# Patient Record
Sex: Female | Born: 2000 | Race: Black or African American | Hispanic: No | Marital: Single | Smoking: Never smoker
Health system: Southern US, Community
[De-identification: ages and names within clinical notes are randomized; demographics above are authoritative.]

## PROBLEM LIST (undated history)

## (undated) DIAGNOSIS — T4145XA Adverse effect of unspecified anesthetic, initial encounter: Secondary | ICD-10-CM

## (undated) DIAGNOSIS — T8859XA Other complications of anesthesia, initial encounter: Secondary | ICD-10-CM

## (undated) DIAGNOSIS — Z8249 Family history of ischemic heart disease and other diseases of the circulatory system: Secondary | ICD-10-CM

## (undated) DIAGNOSIS — M24411 Recurrent dislocation, right shoulder: Secondary | ICD-10-CM

## (undated) DIAGNOSIS — M419 Scoliosis, unspecified: Secondary | ICD-10-CM

## (undated) HISTORY — PX: WISDOM TOOTH EXTRACTION: SHX21

---
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
## 2018-03-22 ENCOUNTER — Emergency Department (HOSPITAL_COMMUNITY)
Admission: EM | Admit: 2018-03-22 | Discharge: 2018-03-22 | Disposition: A | Discharge status: Home / Self Care | Payer: Medicaid Other | Attending: Emergency Medicine | Admitting: Emergency Medicine

## 2018-03-22 ENCOUNTER — Encounter (HOSPITAL_COMMUNITY): Payer: Self-pay | Admitting: *Deleted

## 2018-03-22 ENCOUNTER — Ambulatory Visit (HOSPITAL_COMMUNITY)
Admission: EM | Admit: 2018-03-22 | Discharge: 2018-03-22 | Discharge status: Absent without leave | Payer: No Typology Code available for payment source

## 2018-03-22 ENCOUNTER — Other Ambulatory Visit: Payer: Self-pay

## 2018-03-22 DIAGNOSIS — F17290 Nicotine dependence, other tobacco product, uncomplicated: Secondary | ICD-10-CM | POA: Insufficient documentation

## 2018-03-22 DIAGNOSIS — I951 Orthostatic hypotension: Secondary | ICD-10-CM | POA: Insufficient documentation

## 2018-03-22 DIAGNOSIS — R55 Syncope and collapse: Secondary | ICD-10-CM | POA: Diagnosis present

## 2018-03-22 LAB — BASIC METABOLIC PANEL
Anion gap: 7 (ref 5–15)
BUN: 7 mg/dL (ref 6–20)
CALCIUM: 9.3 mg/dL (ref 8.9–10.3)
CO2: 22 mmol/L (ref 22–32)
CREATININE: 0.89 mg/dL (ref 0.44–1.00)
Chloride: 106 mmol/L (ref 98–111)
GFR calc Af Amer: >60 mL/min (ref >60)
GLUCOSE: 107 mg/dL — AB (ref 70–99)
Potassium: 3.6 mmol/L (ref 3.5–5.1)
Sodium: 135 mmol/L (ref 135–145)

## 2018-03-22 LAB — CBC
HCT: 36 % (ref 36.0–46.0)
Hemoglobin: 11.8 g/dL — ABNORMAL LOW (ref 12.0–15.0)
MCH: 31.1 pg (ref 26.0–34.0)
MCHC: 32.8 g/dL (ref 30.0–36.0)
MCV: 95 fL (ref 78.0–100.0)
Platelets: 204 10*3/uL (ref 150–400)
RBC: 3.79 MIL/uL — ABNORMAL LOW (ref 3.87–5.11)
RDW: 12.5 % (ref 11.5–15.5)
WBC: 6.1 10*3/uL (ref 4.0–10.5)

## 2018-03-22 LAB — I-STAT BETA HCG BLOOD, ED (MC, WL, AP ONLY): I-stat hCG, quantitative: <5 m[IU]/mL (ref <5)

## 2018-03-22 MED ORDER — SODIUM CHLORIDE 0.9 % IV BOLUS
1000.0000 mL | Freq: Once | INTRAVENOUS | Status: AC
Start: 2018-03-22 — End: 2018-03-22
  Administered 2018-03-22: 1000 mL via INTRAVENOUS

## 2018-03-22 NOTE — ED Triage Notes (Signed)
Pt states she keeps having fainting spells and has had 3 episodes over the last 1-2 months.  Pt gets really hot and dizzy and then everything goes black.  [place] and felt her face hit [place].  Pt has pain to left face where she fell.

## 2018-03-22 NOTE — ED Notes (Signed)
Pt stable, ambulatory, states understanding of discharge instructions 

## 2018-03-22 NOTE — Discharge Instructions (Signed)
Contact a health care provider if: °You vomit. °You have diarrhea. °You have a fever for more than 2-3 days. °You feel more thirsty than usual. °You feel weak and tired. °Get help right away if: °You have chest pain. °You have a fast or irregular heartbeat. °You develop numbness in any part of your body. °You cannot move your arms or your legs. °You have trouble speaking. °You become sweaty or feel light-headed. °You faint. °You feel short of breath. °You have trouble staying awake. °You feel confused. °

## 2018-03-22 NOTE — ED Provider Notes (Signed)
[facility] EMERGENCY DEPARTMENT Provider Note   CSN: [id number] Arrival date & time: 03/22/18  1334     History   Chief Complaint Chief Complaint  Patient presents with  . Loss of Consciousness    HPI Theresa Edwards is a 17 y.o. female who presents the emergency department chief complaint of syncope.  Patient has had 2 previous episodes of presyncope however had her first episode of true syncope today.  About a month ago the patient was ordering food when she had onset of symptoms of presyncope including tunnel vision, wearing in her ears, lightheadedness feeling like she is going to pass out.  She did not fully lose consciousness and walked out the front door and sat down on the curb.  She was able to sit and then regain stability.  She denies full loss of consciousness, racing or skipping in her heart, chest pain or shortness of breath.  Patient had a second similar episode after walking her dog but was able to get to her bed.  Today the patient was ordering lunch at [employer].  She started to feel very lightheaded she got hot she went to the bathroom and sat on the toilet states that she was feeling better but she still heard the whirring sound in her ears .  She went back to the counter to pay for her food then suddenly she remembers hitting the ground feeling the cold floor on her face.  She is not fully lost consciousness before today.  Patient has no unilateral leg swelling, chest pain, hemoptysis.  She does not smoke cigarettes or use nicotine.  She does not take exogenous estrogens.  Patient has no family history of sudden cardiac death.  She is taking no medications.  She denies volume loss or dehydration.  HPI  Past Medical History:  Diagnosis Date  . Complication of anesthesia    has hx. of shaking after anesthesia  . Family history of blood clots   . 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 Scoliosis     Patient Active Problem List   Diagnosis  Date Noted  . Anxiety 08/09/2017  . Unintentional weight loss 08/09/2017  . 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 Recurrent dislocation of right shoulder   . Family history of blood clots     Past Surgical History:  Procedure Laterality Date  . KNEE ARTHROSCOPY WITH ANTERIOR CRUCIATE LIGAMENT (ACL) REPAIR WITH HAMSTRING GRAFT Right 11/30/2014   Procedure: RIGHT KNEE ARTHROSCOPY WITH ANTERIOR CRUCIATE LIGAMENT (ACL)  AUTOGRAFT  HAMSTRING, GRAFT LINK TECHNIQUE;  Surgeon: Salvatore Marvel, MD;  Location: [facility];  Service: Orthopedics;  Laterality: Right;  . MENISCUS REPAIR Right 11/30/2014   Procedure: LATERAL MENISCUS REPAIR;  Surgeon: Salvatore Marvel, MD;  Location: [facility];  Service: Orthopedics;  Laterality: Right;  . SHOULDER ARTHROSCOPY WITH BANKART REPAIR Right 12/10/2016   Procedure: SHOULDER ARTHROSCOPY WITH CAPSULORRHAPHY  REPAIR WITH SCALENE BLOCK;  Surgeon: Salvatore Marvel, MD;  Location: [facility];  Service: Orthopedics;  Laterality: Right;  . SHOULDER ARTHROSCOPY WITH CAPSULORRHAPHY Right 12/09/2017   Procedure: RIGHT SHOULDER REVISION ANTERIOR AND Rudene Re;  Surgeon: Salvatore Marvel, MD;  Location:  SURGERY CENTER;  Service: Orthopedics;  Laterality: Right;  GENERAL,PRE/POST OP SCALENE  . WISDOM TOOTH EXTRACTION       OB History   None      Home Medications    Prior to Admission medications   Medication Sig Start Date End  Date Taking? Authorizing Provider  ibuprofen (ADVIL,MOTRIN) 100 MG tablet Take 100 mg by mouth every 6 (six) hours as needed for pain or fever.   Yes [provider]  Multiple Vitamins-Minerals (MULTIVITAMIN WITH MINERALS) tablet Take 1 tablet by mouth daily.   Yes [provider]  cyclobenzaprine (FLEXERIL) 5 MG tablet Take 1 tablet (5 mg total) by mouth 3 (three) times daily as needed for muscle spasms. Patient not taking: Reported on 03/22/2018 12/09/17    Shepperson, Kirstin, PA-C  oxyCODONE (OXY IR/ROXICODONE) 5 MG immediate release tablet Take 1 tablet (5 mg total) by mouth every 4 (four) hours as needed for severe pain. Patient not taking: Reported on 03/22/2018 12/09/17   Julien Girt, PA-C    Family History Family History  Problem Relation Age of Onset  . Heart disease Maternal Aunt        MI  . Heart disease Paternal Grandfather        MI  . Hypertension Mother   . Hypertension Maternal Uncle   . Kidney disease Maternal Uncle     Social History Social History   Tobacco Use  . Smoking status: Current Every Day Smoker    Types: E-cigarettes  . Smokeless tobacco: Never Used  Substance Use Topics  . Alcohol use: No  . Drug use: Yes    Types: Marijuana     Allergies   Patient has no known allergies.   Review of Systems Review of Systems  Ten systems reviewed and are negative for acute change, except as noted in the HPI.   Physical Exam Updated Vital Signs BP 105/61   Pulse (!) 52   Temp 98.6 F (37 C) (Oral)   Resp 17   Ht 5\' 5"  (1.651 m)   Wt 51.3 kg   LMP 03/02/2018 (Approximate)   SpO2 100%   BMI 18.80 kg/m   Physical Exam  Constitutional: She is oriented to person, place, and time. She appears well-developed and well-nourished. No distress.  HENT:  Head: Normocephalic and atraumatic.  Eyes: Conjunctivae are normal. No scleral icterus.  Neck: Normal range of motion.  Cardiovascular: Normal rate, regular rhythm and normal heart sounds. Exam reveals no gallop and no friction rub.  No murmur heard. Pulmonary/Chest: Effort normal and breath sounds normal. No respiratory distress.  Abdominal: Soft. Bowel sounds are normal. She exhibits no distension and no mass. There is no tenderness. There is no guarding.  Neurological: She is alert and oriented to person, place, and time.  Speech is clear and goal oriented, follows commands Major Cranial nerves without deficit, no facial droop Normal strength  in upper and lower extremities bilaterally including dorsiflexion and plantar flexion, strong and equal grip strength Sensation normal to light and sharp touch Moves extremities without ataxia, coordination intact Normal finger to nose and rapid alternating movements Neg romberg, no pronator drift Normal gait Normal heel-shin and balance   Skin: Skin is warm and dry. Capillary refill takes less than 2 seconds. She is not diaphoretic.  Psychiatric: Her behavior is normal.  Nursing note and vitals reviewed.    ED Treatments / Results  Labs (all labs ordered are listed, but only abnormal results are displayed) Labs Reviewed  BASIC METABOLIC PANEL - Abnormal; Notable for the following components:      Result Value   Glucose, Bld 107 (*)    All other components within normal limits  CBC - Abnormal; Notable for the following components:   RBC 3.79 (*)    Hemoglobin  11.8 (*)    All other components within normal limits  URINALYSIS, ROUTINE W REFLEX MICROSCOPIC  CBG MONITORING, ED  I-STAT BETA HCG BLOOD, ED ([facility], [facility], [facility] ONLY)    EKG None  Radiology No results found.  Procedures Procedures (including critical care time)  Medications Ordered in ED Medications  sodium chloride 0.9 % bolus 1,000 mL (has no administration in time range)     Initial Impression / Assessment and Plan / ED Course  I have reviewed the triage vital signs and the nursing notes.  Pertinent labs & imaging results that were available during my care of the patient were reviewed by me and considered in my medical decision making (see chart for details).  Clinical Course as of Mar 22 1600  Sat Mar 22, 2018  1557 Patient with positive orthostatic VS.   Her EKG is shows sinus arrhythmia.  She meets PERC.  Her chemistry is negative and her hemoglobin is just below normal.  Patient will get a bolus of fluid and we will discharge her to follow-up with her primary care physician in the outpatient setting.   [AH]     Clinical Course User Index [AH] Arthor Captain, PA-C   Patient here for syncope.  History gathered by the patient and her mother who is at bedside the differential for syncope is extensive and includes, but is not limited to: arrythmia (Vtach, SVT, SSS, sinus arrest, AV block, bradycardia) aortic stenosis, AMI, HOCM, PE, atrial myxoma, pulmonary hypertension, orthostatic hypotension, (hypovolemia, drug effect, GB syndrome, micturition, cough, swall) carotid sinus sensitivity, Seizure, TIA/CVA, hypoglycemia,  Vertigo. EKG is unremarkable.  Lab work shows no evidence of anemia, severe dehydration.  She does have positive orthostatic.  Given fluid bolus here.  Patient advised to follow closely with her primary care physician in the next 7 to 10 days for further work-up including potential cardiac event monitoring.  She is PERC negative.  Appears appropriate for discharge at this time  Final Clinical Impressions(s) / ED Diagnoses   Final diagnoses:  Orthostatic syncope    ED Discharge Orders    None       Arthor Captain, PA-C 03/22/18 1653    Little, Ambrose Finland, MD 03/23/18 [phone]

## 2018-04-11 ENCOUNTER — Ambulatory Visit (INDEPENDENT_AMBULATORY_CARE_PROVIDER_SITE_OTHER): Payer: Medicaid Other | Admitting: Family Medicine

## 2018-04-11 VITALS — BP 109/80 | HR 79 | Temp 98.4°F | Wt 113.8 lb

## 2018-04-11 DIAGNOSIS — R6889 Other general symptoms and signs: Secondary | ICD-10-CM

## 2018-04-11 NOTE — Progress Notes (Signed)
  Subjective:    Patient ID: Theresa Edwards, female    DOB: 05-29-2001, 17 y.o.   MRN: [id number]   CC: cold all the time (Patient 20 min late to appointment but requested to be seen with this 1 complaint)  HPI:  Cold intolerance: Patient reports that she is cold all the time. She says she has felt this way her entire life, but she is coming in today because she is worried it may have been related to a recent episode where she passed out. She was see in the ED, given fluids and evaluated with  BMP, CBC and urine pregnancy which revealed normal labs except that patient is anemic with Hgb of 11.8. Patient started multivitamin with iron since then. She is concerned because she is also not able to gain weight. She currently has a job and is going to school for business. She is not concerned with school, states that it is hard, but she is doing fine.   Smoking status reviewed  ROS: 10 point ROS is otherwise negative, except as mentioned in HPI  Patient Active Problem List   Diagnosis Date Noted  . 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 Anxiety 08/09/2017  . Unintentional weight loss 08/09/2017  . 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 Recurrent dislocation of right shoulder   . Family history of blood clots      Objective:  BP 109/80   Pulse 79   Temp 98.4 F (36.9 C)   Wt 113 lb 12.8 oz (51.6 kg)   SpO2 100%   BMI 18.94 kg/m  Vitals and nursing note reviewed  General: NAD, pleasant Cardiac: RRR, normal heart sounds, no murmurs Respiratory: CTAB, normal effort Extremities: no edema or cyanosis. WWP. Skin: warm and dry, no rashes noted Neuro: alert and oriented, no focal deficits Psych: flat affect, patient appears withdrawn  Assessment & Plan:   Cold intolerance Likely that this is simply patient's baseline. Doubt related to anemia with hgb only slightly low at 11.8. Patient now taking supplements. Patient does have some sx c/w hyperthyroidism such as inability to gain  weight and some weight loss, some tachycardia, although cold intolerance would fit more with hypothyroidism. Patient states that she eats regular meals as she is trying to gain weight, so doubt anorexia. Patient declined any blood work today, but will return in 6 weeks to discuss this issue more and will get blood work at that time.  - Will check TSH, and will recheck hgb in 6 weeks to determine if iron supplements helping - Will obtain PHQ9 and GAD 7 at follow up appt   [place] Imogene Gravelle, DO Family Medicine Resident PGY-2

## 2018-04-11 NOTE — Patient Instructions (Signed)
Thank you for coming to see me today. It was a pleasure! Today we talked about:   For feeling cold all the time. Please continue taking your iron supplementation. You could just often be colder than other people around since this is a chronic finding.   Please follow-up with me in 6 weeks or sooner as needed. Please call and come in for lab work to make sure that your thyroid is not causing these symptoms, or you may wait until your next appointment to have labs drawn.   If you have any questions or concerns, please do not hesitate to call the office at [phone].  Take Care,   [place] Demitrus Francisco, DO

## 2018-04-15 ENCOUNTER — Encounter: Payer: Self-pay | Admitting: Family Medicine

## 2018-04-15 DIAGNOSIS — R6889 Other general symptoms and signs: Secondary | ICD-10-CM | POA: Insufficient documentation

## 2018-04-15 NOTE — Assessment & Plan Note (Addendum)
Likely that this is simply patient's baseline. Doubt related to anemia with hgb only slightly low at 11.8. Patient now taking supplements. Patient does have some sx c/w hyperthyroidism such as inability to gain weight and some weight loss, some tachycardia, although cold intolerance would fit more with hypothyroidism. Patient states that she eats regular meals as she is trying to gain weight, so doubt anorexia. Patient declined any blood work today, but will return in 6 weeks to discuss this issue more and will get blood work at that time.  - Will check TSH, and will recheck hgb in 6 weeks to determine if iron supplements helping - Will obtain PHQ9 and GAD 7 at follow up appt

## 2018-05-13 ENCOUNTER — Encounter

## 2019-06-09 ENCOUNTER — Ambulatory Visit: Payer: HRSA Program | Attending: Internal Medicine

## 2019-06-09 DIAGNOSIS — U071 COVID-19: Secondary | ICD-10-CM

## 2019-06-09 DIAGNOSIS — R238 Other skin changes: Secondary | ICD-10-CM | POA: Diagnosis present

## 2019-06-09 DIAGNOSIS — Z20828 Contact with and (suspected) exposure to other viral communicable diseases: Secondary | ICD-10-CM | POA: Diagnosis not present

## 2019-06-10 LAB — NOVEL CORONAVIRUS, NAA: SARS-CoV-2, NAA: NOT DETECTED

## 2019-07-01 ENCOUNTER — Other Ambulatory Visit: Payer: Self-pay

## 2020-02-08 ENCOUNTER — Other Ambulatory Visit: Payer: Self-pay

## 2020-02-08 ENCOUNTER — Encounter (HOSPITAL_BASED_OUTPATIENT_CLINIC_OR_DEPARTMENT_OTHER): Payer: Self-pay | Admitting: *Deleted

## 2020-02-08 ENCOUNTER — Emergency Department (HOSPITAL_BASED_OUTPATIENT_CLINIC_OR_DEPARTMENT_OTHER)
Admission: EM | Admit: 2020-02-08 | Discharge: 2020-02-08 | Disposition: A | Discharge status: Home / Self Care | Payer: Self-pay | Attending: Emergency Medicine | Admitting: Emergency Medicine

## 2020-02-08 DIAGNOSIS — R6889 Other general symptoms and signs: Secondary | ICD-10-CM | POA: Insufficient documentation

## 2020-02-08 DIAGNOSIS — Z5321 Procedure and treatment not carried out due to patient leaving prior to being seen by health care provider: Secondary | ICD-10-CM | POA: Insufficient documentation

## 2020-02-08 NOTE — ED Triage Notes (Signed)
States she wants her VS checked. Her veins are popping out and she wants to make sure she is not dehydrated or her iron may be low.

## 2020-06-25 ENCOUNTER — Encounter (HOSPITAL_BASED_OUTPATIENT_CLINIC_OR_DEPARTMENT_OTHER): Payer: Self-pay | Admitting: Emergency Medicine

## 2020-06-25 ENCOUNTER — Emergency Department (HOSPITAL_BASED_OUTPATIENT_CLINIC_OR_DEPARTMENT_OTHER)
Admission: EM | Admit: 2020-06-25 | Discharge: 2020-06-25 | Disposition: A | Discharge status: Home / Self Care | Payer: Self-pay | Attending: Emergency Medicine | Admitting: Emergency Medicine

## 2020-06-25 ENCOUNTER — Other Ambulatory Visit: Payer: Self-pay

## 2020-06-25 DIAGNOSIS — R1110 Vomiting, unspecified: Secondary | ICD-10-CM | POA: Insufficient documentation

## 2020-06-25 DIAGNOSIS — Z5321 Procedure and treatment not carried out due to patient leaving prior to being seen by health care provider: Secondary | ICD-10-CM | POA: Insufficient documentation

## 2020-06-25 NOTE — ED Notes (Signed)
No response when called to place in a room. Pt also did not answer earlier when called

## 2020-06-25 NOTE — ED Triage Notes (Signed)
Reports she took a xanax about two hours ago.  Now doesn't feel right.  States I did feel confused but I feel better now.  Denies taking medication trying to hurt herself.  States "I was just being stupid".  Unsure of the dose or who the medication belongs to.  Also reports vomiting X 1 pta.  Currently only complaints is being drowsy.

## 2020-06-25 NOTE — ED Notes (Signed)
Pt called to provide pt with urine specimen collection device, no answer in WR

## 2020-08-25 ENCOUNTER — Other Ambulatory Visit: Payer: Self-pay

## 2020-08-25 ENCOUNTER — Emergency Department (HOSPITAL_BASED_OUTPATIENT_CLINIC_OR_DEPARTMENT_OTHER)
Admission: EM | Admit: 2020-08-25 | Discharge: 2020-08-25 | Disposition: A | Discharge status: Home / Self Care | Payer: Self-pay | Attending: Emergency Medicine | Admitting: Emergency Medicine

## 2020-08-25 ENCOUNTER — Encounter (HOSPITAL_BASED_OUTPATIENT_CLINIC_OR_DEPARTMENT_OTHER): Payer: Self-pay | Admitting: Emergency Medicine

## 2020-08-25 DIAGNOSIS — R112 Nausea with vomiting, unspecified: Secondary | ICD-10-CM | POA: Insufficient documentation

## 2020-08-25 DIAGNOSIS — R197 Diarrhea, unspecified: Secondary | ICD-10-CM | POA: Insufficient documentation

## 2020-08-25 DIAGNOSIS — Z5321 Procedure and treatment not carried out due to patient leaving prior to being seen by health care provider: Secondary | ICD-10-CM | POA: Insufficient documentation

## 2020-08-25 NOTE — ED Triage Notes (Signed)
N/v/d since yesterday  States may be food poisoning

## 2020-09-27 ENCOUNTER — Encounter (HOSPITAL_BASED_OUTPATIENT_CLINIC_OR_DEPARTMENT_OTHER): Payer: Self-pay

## 2020-09-27 ENCOUNTER — Other Ambulatory Visit: Payer: Self-pay

## 2020-09-27 ENCOUNTER — Emergency Department (HOSPITAL_BASED_OUTPATIENT_CLINIC_OR_DEPARTMENT_OTHER)
Admission: EM | Admit: 2020-09-27 | Discharge: 2020-09-28 | Disposition: A | Discharge status: Home / Self Care | Payer: Self-pay | Attending: Emergency Medicine | Admitting: Emergency Medicine

## 2020-09-27 DIAGNOSIS — H579 Unspecified disorder of eye and adnexa: Secondary | ICD-10-CM | POA: Insufficient documentation

## 2020-09-27 DIAGNOSIS — R0981 Nasal congestion: Secondary | ICD-10-CM | POA: Insufficient documentation

## 2020-09-27 DIAGNOSIS — J3489 Other specified disorders of nose and nasal sinuses: Secondary | ICD-10-CM | POA: Insufficient documentation

## 2020-09-27 DIAGNOSIS — R059 Cough, unspecified: Secondary | ICD-10-CM | POA: Insufficient documentation

## 2020-09-27 NOTE — ED Triage Notes (Signed)
Pt states last week she was dx with bronchitis and ear infection, still taking antibiotics and inhaler. Reports she is still having cough and woke up today and her right eye is red and burning and itching.

## 2020-09-28 MED ORDER — ERYTHROMYCIN 5 MG/GM OP OINT
TOPICAL_OINTMENT | Freq: Once | OPHTHALMIC | Status: AC
  Administered 2020-09-28: 1 via OPHTHALMIC
  Filled 2020-09-28: qty 3.5

## 2020-09-28 MED ORDER — AMOXICILLIN-POT CLAVULANATE 875-125 MG PO TABS: 1.0000 | ORAL_TABLET | Freq: Two times a day (BID) | ORAL | 0 refills | Status: AC

## 2020-09-28 NOTE — ED Provider Notes (Signed)
[facility] EMERGENCY DEPARTMENT Provider Note  CSN: [id number] Arrival date & time: 09/27/20 2306  Chief Complaint(s) Cough  HPI Theresa Edwards is a 20 y.o. female   The history is provided by the patient.  Cough Cough characteristics:  Non-productive and hacking Severity:  Moderate Onset quality:  Gradual Duration:  2 weeks Timing:  Constant Progression:  Waxing and waning Chronicity:  New Relieved by:  Nothing Worsened by:  Nothing Ineffective treatments:  Beta-agonist inhaler Associated symptoms: eye discharge and rhinorrhea    Seen by UC and Rx'd Zpak last Thursday. Also given albuterol. No improvement.  Past Medical History Past Medical History:  Diagnosis Date  . Complication of anesthesia    has hx. of shaking after anesthesia  . Family history of blood clots   . 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 Scoliosis    Patient Active Problem List   Diagnosis Date Noted  . 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 Anxiety 08/09/2017  . Unintentional weight loss 08/09/2017  . 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 Recurrent dislocation of right shoulder   . Family history of blood clots    Home Medication(s) Prior to Admission medications   Medication Sig Start Date End Date Taking? Authorizing Provider  amoxicillin-clavulanate (AUGMENTIN) 875-125 MG tablet Take 1 tablet by mouth every 12 (twelve) hours for 10 days. 10/02/20 10/12/20 Yes Leshay Desaulniers, Amadeo GarnetPedro Eduardo, MD  cyclobenzaprine (FLEXERIL) 5 MG tablet Take 1 tablet (5 mg total) by mouth 3 (three) times daily as needed for muscle spasms. Patient not taking: Reported on 03/22/2018 12/09/17   Shepperson, Kirstin, PA-C  ibuprofen (ADVIL,MOTRIN) 100 MG tablet Take 100 mg by mouth every 6 (six) hours as needed for pain or fever.    [provider]  Multiple Vitamins-Minerals (MULTIVITAMIN WITH MINERALS) tablet Take 1 tablet by mouth daily.    [provider]                                                                                                                                     Past Surgical History Past Surgical History:  Procedure Laterality Date  . KNEE ARTHROSCOPY WITH ANTERIOR CRUCIATE LIGAMENT (ACL) REPAIR WITH HAMSTRING GRAFT Right 11/30/2014   Procedure: RIGHT KNEE ARTHROSCOPY WITH ANTERIOR CRUCIATE LIGAMENT (ACL)  AUTOGRAFT  HAMSTRING, GRAFT LINK TECHNIQUE;  Surgeon: Salvatore Marvelobert Wainer, MD;  Location: [facility];  Service: Orthopedics;  Laterality: Right;  . MENISCUS REPAIR Right 11/30/2014   Procedure: LATERAL MENISCUS REPAIR;  Surgeon: Salvatore Marvelobert Wainer, MD;  Location: [facility];  Service: Orthopedics;  Laterality: Right;  . SHOULDER ARTHROSCOPY WITH BANKART REPAIR Right 12/10/2016   Procedure: SHOULDER ARTHROSCOPY WITH CAPSULORRHAPHY  REPAIR WITH SCALENE BLOCK;  Surgeon: Salvatore MarvelWainer, Robert, MD;  Location: [facility];  Service: Orthopedics;  Laterality: Right;  . SHOULDER ARTHROSCOPY WITH CAPSULORRHAPHY Right 12/09/2017   Procedure: RIGHT SHOULDER REVISION ANTERIOR AND POSTEROR CAPSULLORAPHY;  Surgeon:  Salvatore Marvel, MD;  Location: [facility];  Service: Orthopedics;  Laterality: Right;  GENERAL,PRE/POST OP SCALENE  . WISDOM TOOTH EXTRACTION     Family History Family History  Problem Relation Age of Onset  . Heart disease Maternal Aunt        MI  . Heart disease Paternal Grandfather        MI  . Hypertension Mother   . Hypertension Maternal Uncle   . Kidney disease Maternal Uncle     Social History Social History   Tobacco Use  . Smoking status: Never Smoker  . Smokeless tobacco: Never Used  Vaping Use  . Vaping Use: Every day  Substance Use Topics  . Alcohol use: No  . Drug use: Yes    Types: Marijuana   Allergies Patient has no known allergies.  Review of Systems Review of Systems  HENT: Positive for congestion, rhinorrhea and sinus pressure.   Eyes: Positive for discharge and redness.   Respiratory: Positive for cough.    All other systems are reviewed and are negative for acute change except as noted in the HPI  Physical Exam Vital Signs  I have reviewed the triage vital signs BP 126/73 (BP Location: Right Arm)   Pulse 65   Temp 98.7 F (37.1 C) (Oral)   Resp 18   Ht 5\' 6"  (1.676 m)   Wt 50.3 kg   LMP 09/27/2020 (Exact Date)   SpO2 100%   BMI 17.92 kg/m   Physical Exam Vitals reviewed.  Constitutional:      General: She is not in acute distress.    Appearance: She is well-developed. She is not diaphoretic.  HENT:     Head: Normocephalic and atraumatic.     Nose: Mucosal edema, congestion and rhinorrhea present.     Mouth/Throat:     Mouth: No oral lesions or angioedema.     Tongue: No lesions.     Pharynx: No pharyngeal swelling, oropharyngeal exudate or posterior oropharyngeal erythema.     Tonsils: No tonsillar exudate.  Eyes:     General: No scleral icterus.       Right eye: No discharge.        Left eye: No discharge.     Conjunctiva/sclera:     Right eye: Right conjunctiva is injected. Exudate (mild mucousy) present. No chemosis or hemorrhage.    Left eye: Left conjunctiva is not injected. No chemosis, exudate or hemorrhage.    Pupils: Pupils are equal, round, and reactive to light.  Cardiovascular:     Rate and Rhythm: Normal rate and regular rhythm.     Heart sounds: No murmur heard. No friction rub. No gallop.   Pulmonary:     Effort: Pulmonary effort is normal. No respiratory distress.     Breath sounds: Normal breath sounds. No stridor. No rales.  Abdominal:     General: There is no distension.     Palpations: Abdomen is soft.     Tenderness: There is no abdominal tenderness.  Musculoskeletal:        General: No tenderness.     Cervical back: Normal range of motion and neck supple.  Skin:    General: Skin is warm and dry.     Findings: No erythema or rash.  Neurological:     Mental Status: She is alert and oriented to person,  place, and time.     ED Results and Treatments Labs (all labs ordered are listed, but only abnormal results are  displayed) Labs Reviewed - No data to display                                                                                                                       EKG  EKG Interpretation  Date/Time:    Ventricular Rate:    PR Interval:    QRS Duration:   QT Interval:    QTC Calculation:   R Axis:     Text Interpretation:        Radiology No results found.  Pertinent labs & imaging results that were available during my care of the patient were reviewed by me and considered in my medical decision making (see chart for details).  Medications Ordered in ED Medications  erythromycin ophthalmic ointment (1 application Right Eye Given 09/28/20 0026)                                                                                                                                    Procedures Procedures  (including critical care time)  Medical Decision Making / ED Course I have reviewed the nursing notes for this encounter and the patient's prior records (if available in EHR or on provided paperwork).   Theresa Edwards was evaluated in Emergency Department on 09/28/2020 for the symptoms described in the history of present illness. She was evaluated in the context of the global COVID-19 pandemic, which necessitated consideration that the patient might be at risk for infection with the SARS-CoV-2 virus that causes COVID-19. Institutional protocols and algorithms that pertain to the evaluation of patients at risk for COVID-19 are in a state of rapid change based on information released by regulatory bodies including the CDC and federal and state organizations. These policies and algorithms were followed during the patient's care in the ED.  20 y.o. female presents with cough, rhinorrhea, nasal congestion, and now conjunctivitis w/o fever for approx 10 days. Currently on Zpak  and albuterol w/o improvement. Adequate oral hydration. Rest of history as above.  Patient appears well. No signs of toxicity, patient is interactive and playful. No hypoxia, tachypnea or other signs of respiratory distress. No sign of clinical dehydration. Lung exam clear. Rest of exam as above.  Most consistent with allergic rhinitis vs less likely viral upper respiratory infection.   No evidence suggestive of pharyngitis, AOM, PNA, or meningitis.   Chest x-ray not indicated at this time.  Recommended over-the-counter allergy  medicine. Patient was provided with a prescription for antibiotics in case her symptoms do not improve by Monday with the allergy medicine.  Discussed symptomatic treatment with the parents and they will follow closely with their PCP.        Final Clinical Impression(s) / ED Diagnoses Final diagnoses:  Nasal congestion    The patient appears reasonably screened and/or stabilized for discharge and I doubt any other medical condition or other [facility] requiring further screening, evaluation, or treatment in the ED at this time prior to discharge. Safe for discharge with strict return precautions.  Disposition: Discharge  Condition: Good  I have discussed the results, Dx and Tx plan with the patient/family who expressed understanding and agree(s) with the plan. Discharge instructions discussed at length. The patient/family was given strict return precautions who verbalized understanding of the instructions. No further questions at time of discharge.    ED Discharge Orders         Ordered    amoxicillin-clavulanate (AUGMENTIN) 875-125 MG tablet  Every 12 hours        09/28/20 0036             Follow Up: Primary care provider  Schedule an appointment as soon as possible for a visit  if you do not have a primary care physician, contact [facility] at [phone] for referral     This chart was dictated using voice recognition software.  Despite  best efforts to proofread,  errors can occur which can change the documentation meaning.   Nira Conn, MD 09/28/20 [phone]

## 2021-01-17 ENCOUNTER — Other Ambulatory Visit: Payer: Self-pay

## 2021-01-17 ENCOUNTER — Ambulatory Visit (INDEPENDENT_AMBULATORY_CARE_PROVIDER_SITE_OTHER): Payer: 59 | Admitting: Family

## 2021-01-17 ENCOUNTER — Encounter: Payer: Self-pay | Admitting: Family

## 2021-01-17 ENCOUNTER — Other Ambulatory Visit: Payer: Self-pay | Admitting: Family

## 2021-01-17 VITALS — BP 98/60 | HR 82 | Temp 97.9°F | Ht 66.0 in | Wt 126.6 lb

## 2021-01-17 DIAGNOSIS — R5383 Other fatigue: Secondary | ICD-10-CM

## 2021-01-17 DIAGNOSIS — E559 Vitamin D deficiency, unspecified: Secondary | ICD-10-CM | POA: Diagnosis not present

## 2021-01-17 DIAGNOSIS — E039 Hypothyroidism, unspecified: Secondary | ICD-10-CM

## 2021-01-17 DIAGNOSIS — E538 Deficiency of other specified B group vitamins: Secondary | ICD-10-CM | POA: Diagnosis not present

## 2021-01-17 LAB — CBC WITH DIFFERENTIAL/PLATELET
Basophils Absolute: 0 10*3/uL (ref 0.0–0.1)
Basophils Relative: 0.2 % (ref 0.0–3.0)
Eosinophils Absolute: 0 10*3/uL (ref 0.0–0.7)
Eosinophils Relative: 0.2 % (ref 0.0–5.0)
HCT: 36.5 % (ref 36.0–46.0)
Hemoglobin: 12.2 g/dL (ref 12.0–15.0)
Lymphocytes Relative: 21.5 % (ref 12.0–46.0)
Lymphs Abs: 2.5 10*3/uL (ref 0.7–4.0)
MCHC: 33.4 g/dL (ref 30.0–36.0)
MCV: 92.2 fl (ref 78.0–100.0)
Monocytes Absolute: 0.8 10*3/uL (ref 0.1–1.0)
Monocytes Relative: 7.1 % (ref 3.0–12.0)
Neutro Abs: 8.2 10*3/uL — ABNORMAL HIGH (ref 1.4–7.7)
Neutrophils Relative %: 71 % (ref 43.0–77.0)
Platelets: 225 10*3/uL (ref 150.0–400.0)
RBC: 3.96 Mil/uL (ref 3.87–5.11)
RDW: 14.6 % (ref 11.5–15.5)
WBC: 11.6 10*3/uL — ABNORMAL HIGH (ref 4.0–10.5)

## 2021-01-17 LAB — COMPREHENSIVE METABOLIC PANEL
ALT: 10 U/L (ref 0–35)
AST: 14 U/L (ref 0–37)
Albumin: 4.5 g/dL (ref 3.5–5.2)
Alkaline Phosphatase: 61 U/L (ref 39–117)
BUN: 9 mg/dL (ref 6–23)
CO2: 24 mEq/L (ref 19–32)
Calcium: 9.9 mg/dL (ref 8.4–10.5)
Chloride: 104 mEq/L (ref 96–112)
Creatinine, Ser: 0.92 mg/dL (ref 0.40–1.20)
GFR: 88.72 mL/min (ref >60.00)
Glucose, Bld: 85 mg/dL (ref 70–99)
Potassium: 4.3 mEq/L (ref 3.5–5.1)
Sodium: 137 mEq/L (ref 135–145)
Total Bilirubin: 0.8 mg/dL (ref 0.2–1.2)
Total Protein: 7.8 g/dL (ref 6.0–8.3)

## 2021-01-17 LAB — VITAMIN D 25 HYDROXY (VIT D DEFICIENCY, FRACTURES): VITD: 16.21 ng/mL — ABNORMAL LOW (ref 30.00–100.00)

## 2021-01-17 LAB — VITAMIN B12: Vitamin B-12: 178 pg/mL — ABNORMAL LOW (ref 211–911)

## 2021-01-17 LAB — TSH: TSH: 8.41 u[IU]/mL — ABNORMAL HIGH (ref 0.35–5.50)

## 2021-01-17 MED ORDER — VITAMIN D (ERGOCALCIFEROL) 1.25 MG (50000 UNIT) PO CAPS: 50000.0000 [IU] | ORAL_CAPSULE | ORAL | 0 refills | Status: AC

## 2021-01-17 NOTE — Progress Notes (Signed)
Theresa Edwards is a 20 y.o. 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     No current facility-administered medications for this visit.    Allergies: Patient has no known allergies.  Past Medical History:  Diagnosis Date   Complication of anesthesia    has hx. 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   Scoliosis     Past Surgical History:  Procedure Laterality Date   KNEE ARTHROSCOPY WITH ANTERIOR CRUCIATE LIGAMENT (ACL) REPAIR WITH HAMSTRING GRAFT Right 11/30/2014   Procedure: RIGHT KNEE ARTHROSCOPY WITH ANTERIOR CRUCIATE LIGAMENT (ACL)  AUTOGRAFT  HAMSTRING, GRAFT LINK TECHNIQUE;  Surgeon: Elsie Saas, MD;  Location: [place];  Service: Orthopedics;  Laterality: Right;   MENISCUS REPAIR Right 11/30/2014   Procedure: LATERAL MENISCUS REPAIR;  Surgeon: Elsie Saas, MD;  Location: [place];  Service: Orthopedics;  Laterality: Right;   SHOULDER ARTHROSCOPY WITH BANKART REPAIR Right 12/10/2016   Procedure: SHOULDER ARTHROSCOPY WITH CAPSULORRHAPHY  REPAIR WITH SCALENE BLOCK;  Surgeon: Elsie Saas, MD;  Location: [place];  Service: Orthopedics;  Laterality: Right;   SHOULDER ARTHROSCOPY WITH CAPSULORRHAPHY Right 12/09/2017   Procedure: RIGHT SHOULDER REVISION ANTERIOR AND Amanda Cockayne;  Surgeon: Elsie Saas, MD;  Location: [place];  Service: Orthopedics;  Laterality: Right;   GENERAL,PRE/POST OP SCALENE   WISDOM TOOTH EXTRACTION      Family History  Problem Relation Age of Onset   Heart disease Maternal Aunt        MI   Heart disease Paternal Grandfather        MI   Hypertension Mother    Hypertension Maternal Uncle    Kidney disease Maternal Uncle     Social History   Tobacco Use   Smoking status: Never   Smokeless tobacco: Never  Substance Use Topics   Alcohol use: No    Subjective:   Presents today as a new patient; accompanied by family member; complaining of heat insensitivity- I feel dizzy when I get too hot; symptoms present for years; would like to get labs today; history of pre-syncope/ syncope in 2020 with similar complaints; found to have Vitamin D and B12 deficiency-  not currently taking any type of supplement; Describes diet as "healthy." Denies any sleeping concerns;    LMP- July 15; planning to establish care with GYN;     Objective:  Vitals:   01/17/21 0900  BP: 98/60  Pulse: 82  Temp: 97.9 F (36.6 C)  TempSrc: Oral  SpO2: 99%  Weight: 126 lb 9.6 oz (57.4 kg)  Height: 5' 6"  (1.676 m)    General: Well developed, well nourished, in no acute distress  Skin : Warm and dry.  Head: Normocephalic and atraumatic  Eyes: Sclera and conjunctiva clear; pupils round and reactive to light; extraocular movements intact  Ears: External normal; canals clear; tympanic membranes normal  Oropharynx: Pink, supple. No suspicious lesions  Neck: Supple without  thyromegaly, adenopathy  Lungs: Respirations unlabored; clear to auscultation bilaterally without wheeze, rales, rhonchi  CVS exam: normal rate and regular rhythm.  Abdomen: Soft; nontender; nondistended; normoactive bowel sounds; no masses or hepatosplenomegaly  Musculoskeletal: No deformities; no active joint inflammation  Extremities: No edema, cyanosis, clubbing  Vessels: Symmetric bilaterally  Neurologic: Alert and oriented; speech intact; face symmetrical; moves all  extremities well; CNII-XII intact without focal deficit   Assessment:  1. Other fatigue   2. Low vitamin B12 level   3. Vitamin D deficiency     Plan:  Update labs today- suspect will need to get back on Vitamin D and Vitamin B12 supplements;  Also stressed to patient the need to stop drinking energy drinks and try drinking more water; She also knows the need to quit vaping and will plan to work on making this change in the coming months;  Follow up to be determined based on lab results; encouraged to schedule follow up with GYN as planned;  This visit occurred during the SARS-CoV-2 public health emergency.  Safety protocols were in place, including screening questions prior to the visit, additional usage of staff PPE, and extensive cleaning of exam room while observing appropriate contact time as indicated for disinfecting solutions.    No follow-ups on file.  Orders Placed This Encounter  Procedures   CBC with Differential/Platelet   Comp Met (CMET)   TSH   Vitamin D (25 hydroxy)   B12    Requested Prescriptions    No prescriptions requested or ordered in this encounter

## 2021-01-24 ENCOUNTER — Other Ambulatory Visit: Payer: Self-pay

## 2021-01-24 ENCOUNTER — Ambulatory Visit (INDEPENDENT_AMBULATORY_CARE_PROVIDER_SITE_OTHER): Payer: 59

## 2021-01-24 ENCOUNTER — Ambulatory Visit: Payer: 59

## 2021-01-24 DIAGNOSIS — E538 Deficiency of other specified B group vitamins: Secondary | ICD-10-CM | POA: Diagnosis not present

## 2021-01-24 MED ORDER — CYANOCOBALAMIN 1000 MCG/ML IJ SOLN
1000.0000 ug | Freq: Once | INTRAMUSCULAR | Status: AC
  Administered 2021-01-24: 1000 ug via INTRAMUSCULAR

## 2021-01-24 NOTE — Progress Notes (Signed)
Theresa Edwards is a 20 y.o. female presents to the office today for b12 injection weekly 1/4, per physician's orders. Original order: 01/2021 cyanocobalamin (med), 1,000 mcg (dose),  IM (route) was administered left deltoid (location) today. Patient tolerated injection.  Patient next injection due: one week, appt made Yes  Routed to DOD in absence of PCP.   Jones Bales

## 2021-01-31 ENCOUNTER — Other Ambulatory Visit: Payer: Self-pay

## 2021-01-31 ENCOUNTER — Ambulatory Visit (INDEPENDENT_AMBULATORY_CARE_PROVIDER_SITE_OTHER): Payer: 59

## 2021-01-31 DIAGNOSIS — E538 Deficiency of other specified B group vitamins: Secondary | ICD-10-CM

## 2021-01-31 MED ORDER — CYANOCOBALAMIN 1000 MCG/ML IJ SOLN
1000.0000 ug | Freq: Once | INTRAMUSCULAR | Status: AC
  Administered 2021-01-31: 1000 ug via INTRAMUSCULAR

## 2021-01-31 NOTE — Progress Notes (Signed)
Theresa Edwards is a 20 y.o. female presents to the office today for weekly b12 injection per physician's orders. Original order: 01/17/2021 cyanocobalamin (med), 1,000 mcg (dose),  IM (route) was administered left deltoid (location) today. Patient tolerated injection. Patient next injection due: one week, appt made Yes  Jones Bales

## 2021-02-07 ENCOUNTER — Ambulatory Visit (INDEPENDENT_AMBULATORY_CARE_PROVIDER_SITE_OTHER): Payer: 59

## 2021-02-07 ENCOUNTER — Other Ambulatory Visit: Payer: Self-pay

## 2021-02-07 DIAGNOSIS — E538 Deficiency of other specified B group vitamins: Secondary | ICD-10-CM

## 2021-02-07 MED ORDER — CYANOCOBALAMIN 1000 MCG/ML IJ SOLN
1000.0000 ug | Freq: Once | INTRAMUSCULAR | Status: AC
  Administered 2021-02-07: 1000 ug via INTRAMUSCULAR

## 2021-02-07 NOTE — Progress Notes (Signed)
Theresa Edwards is a 20 y.o. female presents to the office today for weekly b12 injections per physician's orders.  cyanocobalamin (med), 1,000 mcg (dose),  IM (route) was administered right deltoid (location) today. Patient tolerated injection. Patient due for follow up labs/provider appt: No. Date  Patient next injection due: one week, appt made Yes  Jones Bales

## 2021-02-14 ENCOUNTER — Other Ambulatory Visit: Payer: Self-pay

## 2021-02-14 ENCOUNTER — Ambulatory Visit (INDEPENDENT_AMBULATORY_CARE_PROVIDER_SITE_OTHER): Payer: 59

## 2021-02-14 DIAGNOSIS — E538 Deficiency of other specified B group vitamins: Secondary | ICD-10-CM | POA: Diagnosis not present

## 2021-02-14 MED ORDER — CYANOCOBALAMIN 1000 MCG/ML IJ SOLN
1000.0000 ug | Freq: Once | INTRAMUSCULAR | Status: AC
  Administered 2021-02-14: 1000 ug via INTRAMUSCULAR

## 2021-02-14 NOTE — Progress Notes (Signed)
Theresa Edwards is a 20 y.o. female presents to the office today for weeklu b12 injection per physician's orders.  cyanocobalamin (med), 1,000 mcg (dose),  IM (route) was administered left deltoid (location) today. Patient tolerated injection. P Jones Bales

## 2021-06-21 ENCOUNTER — Ambulatory Visit (INDEPENDENT_AMBULATORY_CARE_PROVIDER_SITE_OTHER): Payer: 59 | Admitting: Family Medicine

## 2021-06-21 ENCOUNTER — Encounter: Payer: Self-pay | Admitting: Family Medicine

## 2021-06-21 VITALS — BP 102/70 | HR 101 | Temp 99.1°F | Ht 67.0 in | Wt 127.1 lb

## 2021-06-21 DIAGNOSIS — J0100 Acute maxillary sinusitis, unspecified: Secondary | ICD-10-CM | POA: Diagnosis not present

## 2021-06-21 DIAGNOSIS — H6983 Other specified disorders of Eustachian tube, bilateral: Secondary | ICD-10-CM

## 2021-06-21 MED ORDER — PREDNISONE 20 MG PO TABS
40.0000 mg | ORAL_TABLET | Freq: Every day | ORAL | 0 refills | Status: AC
Start: 2021-06-21 — End: 2021-06-26

## 2021-06-21 NOTE — Patient Instructions (Addendum)
Consider using an air humidifier to help break things up.  OK to continue Mucinex.  Continue to push fluids, practice good hand hygiene, and cover your mouth if you cough.  If you start having fevers, shaking or shortness of breath, seek immediate care.  OK to take Tylenol 1000 mg (2 extra strength tabs) or 975 mg (3 regular strength tabs) every 6 hours as needed.  Send me a message early next week or over the weekend if we aren't turning the corner.   Let us know if you need anything.

## 2021-06-21 NOTE — Progress Notes (Signed)
Chief Complaint  Patient presents with   congestion and ear pain    Theresa Edwards here for URI complaints.  Duration: 3 days  Associated symptoms: sinus headache, sinus congestion, sinus pain, coughing from drainage, and ear fullness Denies: rhinorrhea, itchy watery eyes, ear pain, ear drainage, sore throat, wheezing, shortness of breath, myalgia, and fevers Treatment to date: Mucinex Sick contacts: No Tested neg for covid.  Past Medical History:  Diagnosis Date   Complication of anesthesia    has hx. 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   Scoliosis    Objective BP 102/70    Pulse (!) 101    Temp 99.1 F (37.3 C) (Oral)    Ht 5\' 7"  (1.702 m)    Wt 127 lb 2 oz (57.7 kg)    SpO2 97%    BMI 19.91 kg/m  General: Awake, alert, appears stated age HEENT: AT, [place], ears patent b/l and TM's neg, nares patent w/o discharge, max sinus ttp R>L, pharynx pink and without exudates, thick drainage noted in pharynx, MMM Neck: No masses or asymmetry Heart: RRR Lungs: CTAB, no accessory muscle use Psych: Age appropriate judgment and insight, normal mood and affect  Acute maxillary sinusitis, recurrence not specified - Plan: predniSONE (DELTASONE) 20 MG tablet  Dysfunction of both eustachian tubes - Plan: predniSONE (DELTASONE) 20 MG tablet  5 d pred burst 40 mg/d. Tylenol. Air humidifier. Send message in 4-5 d if no improvement. Continue to push fluids, practice good hand hygiene, cover mouth when coughing. F/u prn. If starting to experience fevers, shaking, or shortness of breath, seek immediate care. Pt voiced understanding and agreement to the plan.  Anza, DO 06/21/21 2:35 PM

## 2021-08-23 ENCOUNTER — Ambulatory Visit (INDEPENDENT_AMBULATORY_CARE_PROVIDER_SITE_OTHER): Payer: 59 | Admitting: Medical

## 2021-08-23 ENCOUNTER — Telehealth: Payer: Self-pay | Admitting: Family

## 2021-08-23 VITALS — BP 108/76 | HR 72 | Resp 18 | Ht 67.0 in | Wt 135.4 lb

## 2021-08-23 DIAGNOSIS — L089 Local infection of the skin and subcutaneous tissue, unspecified: Secondary | ICD-10-CM

## 2021-08-23 MED ORDER — CEFTRIAXONE SODIUM 1 G IJ SOLR
1.0000 g | Freq: Once | INTRAMUSCULAR | Status: AC
  Administered 2021-08-23: 1 g via INTRAMUSCULAR

## 2021-08-23 MED ORDER — DOXYCYCLINE HYCLATE 100 MG PO TABS: 100.0000 mg | ORAL_TABLET | Freq: Two times a day (BID) | ORAL | 0 refills | Status: DC

## 2021-08-23 NOTE — Addendum Note (Signed)
Addended by: Maximino Sarin on: 08/23/2021 10:22 AM ? ? Modules accepted: Orders ? ?

## 2021-08-23 NOTE — Telephone Encounter (Signed)
Pt states [facility] told her they do not have  doxycycline (VIBRA-TABS) 100 MG tablet   please advise.  ?

## 2021-08-23 NOTE — Telephone Encounter (Signed)
Called [facility] , they stated they do have the script and its ready for pick up , just dont have patient's insurance info  ? ?Spoke with pt and made her aware ?

## 2021-08-23 NOTE — Patient Instructions (Signed)
Right lower eyelid infection that appears to be consequence of blackhead near medial portion of the lid adjacent to the nasal bridge.  Recent procedure done during facial 1 week ago.  The area has not improved and swelled up over the last 24 hours.  Concern for possible early developing abscess.  Due to location Rocephin IM injection will be beneficial.  Discussed with patient and mother and they agree with the injection.  Also start doxycycline antibiotic twice daily for 10 days.  Rx advisement given. ? ?Apply warm compresses to the area twice daily.  If area of swelling expands then recommend ED evaluation.  In that event might need IV antibiotics.  Also if abscess forms might need I&D.  Note not indicated presently and hopefully would not need since area of infection is ear nose and. ? ?Follow-up in 2 days with PCP or sooner if needed. ?

## 2021-08-23 NOTE — Progress Notes (Signed)
? ?  Subjective:  ? ? Patient ID: KHI PRESTWICH, female    DOB: 21-May-2001, 21 y.o.   MRN: [id number] ? ?HPI ?Pt had recent black head that popped up and she went to get facial at skin therapy to get it removed. She states used tool that pressed and scooped. Got some white material out. Pt put some clay type material to area every other day. Last night she tried to squeeze area and area got more swollen.  ? ? ?No fever, no chills or sweats. No vision changes.  ? ?LMP- one week ago.  ? ? ?Review of Systems  ?Constitutional:  Negative for chills, fatigue and fever.  ?Eyes:  Negative for photophobia, pain, discharge, redness, itching and visual disturbance.  ?     See hpi and exam  ?Respiratory:  Negative for cough, chest tightness, shortness of breath and wheezing.   ?Cardiovascular:  Negative for chest pain and palpitations.  ? ?   ?Objective:  ? Physical Exam ? ?General- No acute distress. Pleasant patient. ?Neck- Full range of motion, no jvd ?Lungs- Clear, even and unlabored. ?Heart- regular rate and rhythm. ?Neurologic- CNII- XII grossly intact.  ? ?Eye-perrl bilateral. Conjunctiva clear. Below rt eye area just adjacent to nasal bridge and below medial portion  rt eye lid swollen indurated area about 10 mm in width(center portion raised but no fluctant). But also mild diffuse swelling of rt lower lid. ? ? ?   ?Assessment & Plan:  ? ?Patient Instructions  ?Right lower eyelid infection that appears to be consequence of blackhead near medial portion of the lid adjacent to the nasal bridge.  Recent procedure done during facial 1 week ago.  The area has not improved and swelled up over the last 24 hours.  Concern for possible early developing abscess.  Due to location Rocephin IM injection will be beneficial.  Discussed with patient and mother and they agree with the injection.  Also start doxycycline antibiotic twice daily for 10 days.  Rx advisement given. ? ?Apply warm compresses to the area twice daily.  If area  of swelling expands then recommend ED evaluation.  In that event might need IV antibiotics.  Also if abscess forms might need I&D.  Note not indicated presently and hopefully would not need since area of infection is ear nose and. ? ?Follow-up in 2 days with PCP or sooner if needed.  ? ?Mackie Pai, PA-C  ?

## 2021-08-25 ENCOUNTER — Encounter: Payer: Self-pay | Admitting: Family

## 2021-08-25 ENCOUNTER — Ambulatory Visit (INDEPENDENT_AMBULATORY_CARE_PROVIDER_SITE_OTHER): Payer: 59 | Admitting: Family

## 2021-08-25 ENCOUNTER — Telehealth: Payer: Self-pay

## 2021-08-25 VITALS — BP 108/76 | HR 76 | Temp 98.1°F | Ht 67.0 in | Wt 134.2 lb

## 2021-08-25 DIAGNOSIS — E559 Vitamin D deficiency, unspecified: Secondary | ICD-10-CM

## 2021-08-25 DIAGNOSIS — R7989 Other specified abnormal findings of blood chemistry: Secondary | ICD-10-CM

## 2021-08-25 DIAGNOSIS — L03211 Cellulitis of face: Secondary | ICD-10-CM

## 2021-08-25 DIAGNOSIS — E538 Deficiency of other specified B group vitamins: Secondary | ICD-10-CM

## 2021-08-25 LAB — VITAMIN D 25 HYDROXY (VIT D DEFICIENCY, FRACTURES): VITD: 12.74 ng/mL — ABNORMAL LOW (ref 30.00–100.00)

## 2021-08-25 LAB — VITAMIN B12: Vitamin B-12: 233 pg/mL (ref 211–911)

## 2021-08-25 MED ORDER — MUPIROCIN 2 % EX OINT: 1.0000 "application " | TOPICAL_OINTMENT | Freq: Two times a day (BID) | CUTANEOUS | 0 refills | Status: DC

## 2021-08-25 NOTE — Progress Notes (Signed)
?Theresa Edwards is a 21 y.o. female with the following history as recorded in EpicCare:  ?Patient Active Problem List  ? Diagnosis Date Noted  ? Cold intolerance 04/15/2018  ? Anxiety 08/09/2017  ? Unintentional weight loss 08/09/2017  ? 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 Recurrent dislocation of right shoulder   ? Family history of blood clots   ?  ?Current Outpatient Medications  ?Medication Sig Dispense Refill  ? doxycycline (VIBRA-TABS) 100 MG tablet Take 1 tablet (100 mg total) by mouth 2 (two) times daily. 20 tablet 0  ? ibuprofen (ADVIL,MOTRIN) 100 MG tablet Take 100 mg by mouth every 6 (six) hours as needed for pain or fever.    ? mupirocin ointment (BACTROBAN) 2 % Apply 1 application. topically 2 (two) times daily. 22 g 0  ? ?No current facility-administered medications for this visit.  ?  ?Allergies: Patient has no known allergies.  ?Past Medical History:  ?Diagnosis Date  ? Complication of anesthesia   ? has hx. of shaking after anesthesia  ? Family history of blood clots   ? Recurrent shoulder dislocation, right 11/2017  ? Scoliosis   ?  ?Past Surgical History:  ?Procedure Laterality Date  ? KNEE ARTHROSCOPY WITH ANTERIOR CRUCIATE LIGAMENT (ACL) REPAIR WITH HAMSTRING GRAFT Right 11/30/2014  ? Procedure: RIGHT KNEE ARTHROSCOPY WITH ANTERIOR CRUCIATE LIGAMENT (ACL)  AUTOGRAFT  HAMSTRING, GRAFT LINK TECHNIQUE;  Surgeon: Salvatore Marvel, MD;  Location: [facility];  Service: Orthopedics;  Laterality: Right;  ? MENISCUS REPAIR Right 11/30/2014  ? Procedure: LATERAL MENISCUS REPAIR;  Surgeon: Salvatore Marvel, MD;  Location: [facility];  Service: Orthopedics;  Laterality: Right;  ? SHOULDER ARTHROSCOPY WITH BANKART REPAIR Right 12/10/2016  ? Procedure: SHOULDER ARTHROSCOPY WITH CAPSULORRHAPHY  REPAIR WITH SCALENE BLOCK;  Surgeon: Salvatore Marvel, MD;  Location: [facility];  Service: Orthopedics;  Laterality: Right;  ? SHOULDER ARTHROSCOPY WITH CAPSULORRHAPHY  Right 12/09/2017  ? Procedure: RIGHT SHOULDER REVISION ANTERIOR AND Rudene Re;  Surgeon: Salvatore Marvel, MD;  Location: [facility];  Service: Orthopedics;  Laterality: Right;  GENERAL,PRE/POST OP SCALENE  ? WISDOM TOOTH EXTRACTION    ?  ?Family History  ?Problem Relation Age of Onset  ? Heart disease Maternal Aunt   ?     MI  ? Heart disease Paternal Grandfather   ?     MI  ? Hypertension Mother   ? Hypertension Maternal Uncle   ? Kidney disease Maternal Uncle   ?  ?Social History  ? ?Tobacco Use  ? Smoking status: Never  ? Smokeless tobacco: Never  ?Substance Use Topics  ? Alcohol use: No  ?  ?Subjective:  ? ?Accompanied by her mother today; Patient was seen on Wednesday with concerns for facial cellulitis/ lesion at bottom corner of right eye; had facial procedure in the past week and suspected that injury occurred during procedure that caused secondary infection;  ?Has been on Doxycycline x 2 days; both mother and patient agree that area of concern is improving; lesion has opened up and is draining; patient denies any vision changes and in agreement that swelling beneath right eye improving;  ? ? ? ? ?Objective:  ?Vitals:  ? 08/25/21 0953  ?BP: 108/76  ?Pulse: 76  ?Temp: 98.1 ?F (36.7 ?C)  ?TempSrc: Oral  ?SpO2: 98%  ?Weight: 134 lb 3.2 oz (60.9 kg)  ?Height: 5\' 7"  (1.702 m)  ?  ?General: Well developed, well nourished, in no acute distress  ?Skin :  Warm and dry. Scabbed lesion noted at bottom corner of right eye with mild surrounding erythema;  ?Head: Normocephalic and atraumatic  ?Eyes: Sclera and conjunctiva clear; pupils round and reactive to light; extraocular movements intact  ?Ears: External normal; canals clear; tympanic membranes normal  ?Oropharynx: Pink, supple. No suspicious lesions  ?Neck: Supple without thyromegaly, adenopathy  ?Lungs: Respirations unlabored;  ?Neurologic: Alert and oriented; speech intact; face symmetrical; moves all extremities well; CNII-XII intact  without focal deficit  ?Assessment:  ?1. Low vitamin B12 level   ?2. Abnormal TSH   ?3. Vitamin D deficiency   ?4. Cellulitis of face   ?  ?Plan:  ?Need to follow up on low B12 level found last August- lab drawn today; ?Patient did not follow up as discussed last summer- repeat TSH today; will need to determine if thyroid ultrasound is needed; this was ordered but patient did not get scheduled; ?Check Vitamin D level; ?Appears to be improving; continue Doxycycline as prescribed; continue warm compreses; add Bactroban; re-check next Friday; strict ER precautions for upcoming weekend;  ? ?This visit occurred during the SARS-CoV-2 public health emergency.  Safety protocols were in place, including screening questions prior to the visit, additional usage of staff PPE, and extensive cleaning of exam room while observing appropriate contact time as indicated for disinfecting solutions.  ? ? ?No follow-ups on file.  ?Orders Placed This Encounter  ?Procedures  ? B12  ? Vitamin D (25 hydroxy)  ? Thyroid Panel With TSH  ?  Standing Status:   Future  ?  Standing Expiration Date:   08/26/2022  ? CBC with Differential/Platelet  ?  Standing Status:   Future  ?  Standing Expiration Date:   08/26/2022  ?  ?Requested Prescriptions  ? ?Signed Prescriptions Disp Refills  ? mupirocin ointment (BACTROBAN) 2 % 22 g 0  ?  Sig: Apply 1 application. topically 2 (two) times daily.  ?  ? ?

## 2021-08-25 NOTE — Telephone Encounter (Signed)
FYI: Enough blood was collected for B12 and D labs only due to dehydration. Pt did not want to come back for recollection. Was made aware to hydrate before labs.  ? ?Pt aware that only these two tests would be ran.  ?

## 2021-08-28 ENCOUNTER — Other Ambulatory Visit: Payer: Self-pay | Admitting: Family

## 2021-08-28 MED ORDER — VITAMIN D (ERGOCALCIFEROL) 1.25 MG (50000 UNIT) PO CAPS
50000.0000 [IU] | ORAL_CAPSULE | ORAL | 0 refills | Status: AC
Start: 2021-08-28 — End: 2021-11-14

## 2021-09-01 ENCOUNTER — Ambulatory Visit: Payer: 59 | Admitting: Family

## 2021-09-01 ENCOUNTER — Telehealth: Payer: Self-pay

## 2021-09-05 ENCOUNTER — Ambulatory Visit: Payer: 59 | Admitting: Family

## 2021-09-27 NOTE — Telephone Encounter (Signed)
error 

## 2022-01-04 ENCOUNTER — Ambulatory Visit (INDEPENDENT_AMBULATORY_CARE_PROVIDER_SITE_OTHER): Payer: 59 | Admitting: Family Medicine

## 2022-01-04 ENCOUNTER — Encounter: Payer: Self-pay | Admitting: Family Medicine

## 2022-01-04 VITALS — BP 110/70 | HR 72 | Temp 98.2°F | Resp 16 | Ht 66.0 in | Wt 133.6 lb

## 2022-01-04 DIAGNOSIS — R197 Diarrhea, unspecified: Secondary | ICD-10-CM | POA: Diagnosis not present

## 2022-01-04 MED ORDER — DICYCLOMINE HCL 10 MG PO CAPS: ORAL_CAPSULE | ORAL | 0 refills | Status: DC

## 2022-01-04 MED ORDER — AZITHROMYCIN 500 MG PO TABS: 500.0000 mg | ORAL_TABLET | Freq: Every day | ORAL | 0 refills | Status: AC

## 2022-01-04 NOTE — Progress Notes (Signed)
Chief Complaint  Patient presents with   Cough    Here for coughing and fever      Subjective Theresa Edwards is a 21 y.o. female who presents with vomiting and diarrhea Symptoms began 1 week ago.  Patient has cramping, diarrhea, and fever Patient denies vomiting, arthralgias, bleeding, recent antibiotic, and URI symptoms Treatment to date: Tylenol Sick contacts: none known  Past Medical History:  Diagnosis Date   Complication of anesthesia    has hx. 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   Scoliosis     Exam BP 110/70 (BP Location: Right Arm, Patient Position: Sitting, Cuff Size: Normal)   Pulse 72   Temp 98.2 F (36.8 C) (Oral)   Resp 16   Ht 5\' 6"  (1.676 m)   Wt 133 lb 9.6 oz (60.6 kg)   SpO2 98%   BMI 21.56 kg/m  General:  well developed, well hydrated, in no apparent distress Skin:  warm, no pallor or diaphoresis, no rashes Throat/Pharynx:  lips and gingiva without lesion; tongue and uvula midline; non-inflamed pharynx; no exudates or postnasal drainage Lungs:  clear to auscultation, breath sounds equal bilaterally, no respiratory distress, no wheezes Cardio:  RRR Abdomen:  abdomen soft, ttp over umbilical region; bowel sounds normal; no masses or organomegaly Psych: Appropriate judgement/insight  Assessment and Plan  Diarrhea of presumed infectious origin - Plan: dicyclomine (BENTYL) 10 MG capsule, azithromycin (ZITHROMAX) 500 MG tablet  Bentyl as needed, stay hydrated, preferably with something containing electrolytes.  If her diarrhea does not improve, worsens, or if she spikes another fever, she will take 3 days of azithromycin 500 mg daily. Avoid aggravating foods, discussed advancing diet. F/u if symptoms fail to improve, sooner if worsening. The patient voiced understanding and agreement to the plan.  [place], DO 01/04/22  4:34 PM

## 2022-01-04 NOTE — Patient Instructions (Signed)
As long as you are vomiting or having diarrhea, please drink fluids with electrolytes like Gatorade, Powerade, or Pedialyte (if you can stomach the taste). Drink these along with water mainly.   If you spike a fever or if your diarrhea worsens, take the antibiotic. Also if you aren't improving, take the azithromycin.  Consider a probiotic.  Let us know if you need anything.

## 2022-01-15 ENCOUNTER — Ambulatory Visit (INDEPENDENT_AMBULATORY_CARE_PROVIDER_SITE_OTHER): Payer: 59 | Admitting: Family

## 2022-01-15 VITALS — BP 116/71 | HR 71 | Temp 98.6°F | Resp 16 | Wt 131.0 lb

## 2022-01-15 DIAGNOSIS — J029 Acute pharyngitis, unspecified: Secondary | ICD-10-CM | POA: Diagnosis not present

## 2022-01-15 DIAGNOSIS — J069 Acute upper respiratory infection, unspecified: Secondary | ICD-10-CM | POA: Diagnosis not present

## 2022-01-15 LAB — POCT RAPID STREP A (OFFICE): Rapid Strep A Screen: NEGATIVE

## 2022-01-15 LAB — POC COVID19 BINAXNOW: SARS Coronavirus 2 Ag: NEGATIVE

## 2022-01-15 MED ORDER — BENZONATATE 100 MG PO CAPS: 100.0000 mg | ORAL_CAPSULE | Freq: Two times a day (BID) | ORAL | 0 refills | Status: DC | PRN

## 2022-01-15 NOTE — Patient Instructions (Signed)
For cough you may use tessalon as needed. For pain you may use tylenol or motrin as needed.  Chloraseptic spray or cepacol lozenges may also be helpful for pain. These are available OTC.   We will let you know how the send out strep test turns out. Drink plenty of fluids. Please call if symptoms worsen or if symptoms are not improved in 3 days.

## 2022-01-15 NOTE — Assessment & Plan Note (Signed)
New. Rapid strep and rapid covid both negative. Will send out Strep swab to lab. I have given her a note out of work tomorrow and Tuesday.  We discussed supportive measures as follows:  For cough you may use tessalon as needed. For pain you may use tylenol or motrin as needed.  Chloraseptic spray or cepacol lozenges may also be helpful for pain. These are available OTC.   We will let you know how the send out strep test turns out. Drink plenty of fluids. Please call if symptoms worsen or if symptoms are not improved in 3 days.

## 2022-01-15 NOTE — Progress Notes (Signed)
Subjective:   By signing my name below, I, Theresa Edwards, attest that this documentation has been prepared under the direction and in the presence of [name], NP 01/15/2022    Patient ID: Theresa Edwards, female    DOB: June 28, 2000, 21 y.o.   MRN: [id number]  Chief Complaint  Patient presents with   Sore Throat    Complains of sore throat since yesterday for 2 days   Headache    Complains of headache with neck and shoulder pain since last night   Cough    Sore Throat  Associated symptoms include coughing and headaches.  Headache  Associated symptoms include coughing and a sore throat. Pertinent negatives include no fever.  Cough Associated symptoms include headaches, myalgias and a sore throat. Pertinent negatives include no fever.   Patient is in today for a office visit.   Sore throat- She complains of sore throat, headache, and cough since yesterday. Yesterday she woke up with a scratchy throat but continued going to work. As the workday progressed her scratchy throat worsened and around 6-8 her throat pain worsened. She also developed aching shoulders and back, headache, and cough. She reports having an episode of diarrhea today. She denies having any fevers. She has not taken a Covid-19 test prior to this visit.    Health Maintenance Due  Topic Date Due   HIV Screening  Never done   Hepatitis C Screening  Never done   PAP-Cervical Cytology Screening  Never done   PAP SMEAR-Modifier  Never done   TETANUS/TDAP  02/01/2020    Past Medical History:  Diagnosis Date   Complication of anesthesia    has hx. 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   Scoliosis     Past Surgical History:  Procedure Laterality Date   KNEE ARTHROSCOPY WITH ANTERIOR CRUCIATE LIGAMENT (ACL) REPAIR WITH HAMSTRING GRAFT Right 11/30/2014   Procedure: RIGHT KNEE ARTHROSCOPY WITH ANTERIOR CRUCIATE LIGAMENT (ACL)   AUTOGRAFT  HAMSTRING, GRAFT LINK TECHNIQUE;  Surgeon: Salvatore Marvel, MD;  Location: [facility];  Service: Orthopedics;  Laterality: Right;   MENISCUS REPAIR Right 11/30/2014   Procedure: LATERAL MENISCUS REPAIR;  Surgeon: Salvatore Marvel, MD;  Location: [facility];  Service: Orthopedics;  Laterality: Right;   SHOULDER ARTHROSCOPY WITH BANKART REPAIR Right 12/10/2016   Procedure: SHOULDER ARTHROSCOPY WITH CAPSULORRHAPHY  REPAIR WITH SCALENE BLOCK;  Surgeon: Salvatore Marvel, MD;  Location: [facility];  Service: Orthopedics;  Laterality: Right;   SHOULDER ARTHROSCOPY WITH CAPSULORRHAPHY Right 12/09/2017   Procedure: RIGHT SHOULDER REVISION ANTERIOR AND Rudene Re;  Surgeon: Salvatore Marvel, MD;  Location: [facility];  Service: Orthopedics;  Laterality: Right;  GENERAL,PRE/POST OP SCALENE   WISDOM TOOTH EXTRACTION      Family History  Problem Relation Age of Onset   Heart disease Maternal Aunt        MI   Heart disease Paternal Grandfather        MI   Hypertension Mother    Hypertension Maternal Uncle    Kidney disease Maternal Uncle     Social History   Socioeconomic History   Marital status: Single    Spouse name: Not on file   Number of children: Not on file   Years of education: Not on file   Highest education level: Not on file  Occupational History   Not on file  Tobacco Use  Smoking status: Never   Smokeless tobacco: Never  Vaping Use   Vaping Use: Every day  Substance and Sexual Activity   Alcohol use: No   Drug use: Yes    Types: Marijuana   Sexual activity: Not Currently    Birth control/protection: Abstinence  Other Topics Concern   Not on file  Social History Narrative   Not on file   Social Determinants of Health   Financial Resource Strain: Not on file  Food Insecurity: Not on file  Transportation Needs: Not on file  Physical Activity: Not on file  Stress: Not on file  Social Connections:  Not on file  Intimate Partner Violence: Not on file    Outpatient Medications Prior to Visit  Medication Sig Dispense Refill   dicyclomine (BENTYL) 10 MG capsule Take 1 tab every 6 hours as needed for abdominal cramping. 60 capsule 0   No facility-administered medications prior to visit.    No Known Allergies  Review of Systems  Constitutional:  Negative for fever.  HENT:  Positive for sore throat.   Respiratory:  Positive for cough.   Musculoskeletal:  Positive for myalgias.  Neurological:  Positive for headaches.       Objective:    Physical Exam Constitutional:      General: She is not in acute distress.    Appearance: Normal appearance. She is not ill-appearing.  HENT:     Head: Normocephalic and atraumatic.     Right Ear: Tympanic membrane, ear canal and external ear normal.     Left Ear: Tympanic membrane, ear canal and external ear normal.     Mouth/Throat:     Mouth: Mucous membranes are moist.     Pharynx: Oropharynx is clear. Posterior oropharyngeal erythema present. No oropharyngeal exudate.  Eyes:     Extraocular Movements: Extraocular movements intact.     Pupils: Pupils are equal, round, and reactive to light.  Cardiovascular:     Rate and Rhythm: Normal rate and regular rhythm.     Heart sounds: Normal heart sounds. No murmur heard.    No gallop.  Pulmonary:     Effort: Pulmonary effort is normal. No respiratory distress.     Breath sounds: Normal breath sounds. No wheezing or rales.  Lymphadenopathy:     Cervical: No cervical adenopathy.  Skin:    General: Skin is warm and dry.  Neurological:     Mental Status: She is alert and oriented to person, place, and time.  Psychiatric:        Judgment: Judgment normal.     BP 116/71 (BP Location: Left Arm, Patient Position: Sitting, Cuff Size: Small)   Pulse 71   Temp 98.6 F (37 C) (Oral)   Resp 16   Wt 131 lb (59.4 kg)   SpO2 100%   BMI 21.14 kg/m  Wt Readings from Last 3 Encounters:   01/15/22 131 lb (59.4 kg)  01/04/22 133 lb 9.6 oz (60.6 kg)  08/25/21 134 lb 3.2 oz (60.9 kg)       Assessment & Plan:   Problem List Items Addressed This Visit       Unprioritized   Viral URI with cough    New. Rapid strep and rapid covid both negative. Will send out Strep swab to lab. I have given her a note out of work tomorrow and Tuesday.  We discussed supportive measures as follows:  For cough you may use tessalon as needed. For pain you may use tylenol or motrin  as needed.  Chloraseptic spray or cepacol lozenges may also be helpful for pain. These are available OTC.   We will let you know how the send out strep test turns out. Drink plenty of fluids. Please call if symptoms worsen or if symptoms are not improved in 3 days.       Other Visit Diagnoses     Sore throat    -  Primary   Relevant Orders   POCT rapid strep A        Meds ordered this encounter  Medications   benzonatate (TESSALON) 100 MG capsule    Sig: Take 1 capsule (100 mg total) by mouth 2 (two) times daily as needed for cough.    Dispense:  20 capsule    Refill:  0    Order Specific Question:   Supervising Provider    Answer:   Danise Edge A [[id number]]    I, Lemont Fillers, NP, personally preformed the services described in this documentation.  All medical record entries made by the scribe were at my direction and in my presence.  I have reviewed the chart and discharge instructions (if applicable) and agree that the record reflects my personal performance and is accurate and complete. 01/15/2022   I,Theresa Edwards,acting as a Neurosurgeon for Lemont Fillers, NP.,have documented all relevant documentation on the behalf of Lemont Fillers, NP,as directed by  Lemont Fillers, NP while in the presence of Lemont Fillers, NP.   Lemont Fillers, NP

## 2022-01-17 ENCOUNTER — Encounter: Payer: Self-pay | Admitting: Family

## 2022-01-17 LAB — CULTURE, GROUP A STREP
MICRO NUMBER:: 13714608
SPECIMEN QUALITY:: ADEQUATE

## 2022-01-17 MED ORDER — SUMATRIPTAN SUCCINATE 50 MG PO TABS: 50.0000 mg | ORAL_TABLET | ORAL | 0 refills | Status: DC | PRN

## 2022-09-28 ENCOUNTER — Encounter: Payer: Self-pay | Admitting: Family

## 2022-09-28 ENCOUNTER — Ambulatory Visit (INDEPENDENT_AMBULATORY_CARE_PROVIDER_SITE_OTHER): Payer: Managed Care, Other (non HMO) | Admitting: Family

## 2022-09-28 VITALS — BP 114/68 | HR 80 | Resp 18 | Ht 66.0 in | Wt 146.4 lb

## 2022-09-28 DIAGNOSIS — R7989 Other specified abnormal findings of blood chemistry: Secondary | ICD-10-CM

## 2022-09-28 DIAGNOSIS — Z0000 Encounter for general adult medical examination without abnormal findings: Secondary | ICD-10-CM

## 2022-09-28 DIAGNOSIS — Z13220 Encounter for screening for lipoid disorders: Secondary | ICD-10-CM

## 2022-09-28 DIAGNOSIS — E559 Vitamin D deficiency, unspecified: Secondary | ICD-10-CM

## 2022-09-28 NOTE — Progress Notes (Signed)
Theresa Edwards is a 22 y.o. 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 May repeat in 2 hours if headache persists or recurs. 10 tablet 0   No current facility-administered medications for this visit.    Allergies: Patient has no known allergies.  Past Medical History:  Diagnosis Date   Complication of anesthesia    has hx. 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   Scoliosis     Past Surgical History:  Procedure Laterality Date   KNEE ARTHROSCOPY WITH ANTERIOR CRUCIATE LIGAMENT (ACL) REPAIR WITH HAMSTRING GRAFT Right 11/30/2014   Procedure: RIGHT KNEE ARTHROSCOPY WITH ANTERIOR CRUCIATE LIGAMENT (ACL)  AUTOGRAFT  HAMSTRING, GRAFT LINK TECHNIQUE;  Surgeon: Salvatore Marvel, MD;  Location: [facility];  Service: Orthopedics;  Laterality: Right;   MENISCUS REPAIR Right 11/30/2014   Procedure: LATERAL MENISCUS REPAIR;  Surgeon: Salvatore Marvel, MD;  Location: [facility];  Service: Orthopedics;  Laterality: Right;   SHOULDER ARTHROSCOPY WITH BANKART REPAIR Right 12/10/2016   Procedure: SHOULDER ARTHROSCOPY WITH CAPSULORRHAPHY  REPAIR WITH SCALENE BLOCK;  Surgeon: Salvatore Marvel, MD;  Location: [facility];  Service: Orthopedics;  Laterality: Right;   SHOULDER ARTHROSCOPY WITH CAPSULORRHAPHY Right 12/09/2017   Procedure: RIGHT SHOULDER REVISION ANTERIOR AND Rudene Re;  Surgeon: Salvatore Marvel, MD;  Location: [facility];  Service: Orthopedics;  Laterality: Right;  GENERAL,PRE/POST OP SCALENE   WISDOM TOOTH EXTRACTION      Family History  Problem Relation Age of Onset   Heart disease Maternal Aunt        MI   Heart disease Paternal Grandfather        MI   Hypertension Mother    Hypertension Maternal Uncle    Kidney disease Maternal Uncle     Social History   Tobacco Use   Smoking status: Never   Smokeless tobacco: Never  Substance Use Topics   Alcohol use: No    Subjective:  Presents today for yearly CPE; history of low B12/ Vitamin D levels/ abnormal TSH- needs to re-checked; Is planning to get scheduled with GYN- periods have always irregular;  Exercises daily;   LMP 3/10- denies any chance of being pregnant  Review of Systems  Constitutional: Negative.   HENT: Negative.    Eyes: Negative.   Respiratory: Negative.    Cardiovascular: Negative.   Gastrointestinal: Negative.   Genitourinary: Negative.   Musculoskeletal: Negative.   Skin: Negative.   Neurological: Negative.   Endo/Heme/Allergies: Negative.   Psychiatric/Behavioral: Negative.       Objective:  Vitals:   09/28/22 1512  BP: 114/68  Pulse: 80  Resp: 18  SpO2: 98%  Weight: 146 lb 6.4 oz (66.4 kg)  Height:  (1.676 m)    General: Well developed, well nourished, in no acute distress  Skin : Warm and dry.  Head: Normocephalic  and atraumatic  Eyes: Sclera and conjunctiva clear; pupils round and reactive to light; extraocular movements intact  Ears: External normal; canals clear; tympanic membranes normal  Oropharynx: Pink, supple. No suspicious lesions  Neck: Supple without thyromegaly, adenopathy  Lungs: Respirations unlabored; clear to auscultation bilaterally without wheeze, rales, rhonchi  CVS exam: normal rate and regular rhythm.  Abdomen: Soft; nontender; nondistended; normoactive bowel sounds; no masses or  hepatosplenomegaly  Musculoskeletal: No deformities; no active joint inflammation  Extremities: No edema, cyanosis, clubbing  Vessels: Symmetric bilaterally  Neurologic: Alert and oriented; speech intact; face symmetrical; moves all extremities well; CNII-XII intact without focal deficit   Assessment:  1. PE (physical exam), annual   2. Low vitamin B12 level   3. Abnormal TSH   4. Vitamin D deficiency   5. Lipid screening     Plan:  Age appropriate preventive healthcare needs addressed; encouraged regular eye doctor and dental exams; encouraged regular exercise; will update labs and refills as needed today; follow-up to be determined; To consider thyroid ultrasound if TSH is elevated again on this set of labs; She is planning to establish with GYN and will get pap smear updated there. She will have information regarding Tdap faxed to our office.   No follow-ups on file.  Orders Placed This Encounter  Procedures   CBC with Differential/Platelet   Comp Met (CMET)   B12   TSH   Vitamin D (25 hydroxy)   Lipid panel    Requested Prescriptions    No prescriptions requested or ordered in this encounter

## 2022-09-29 LAB — CBC WITH DIFFERENTIAL/PLATELET
Absolute Monocytes: 490 cells/uL (ref 200–950)
Basophils Absolute: 43 cells/uL (ref 0–200)
Basophils Relative: 0.6 %
Eosinophils Absolute: 108 cells/uL (ref 15–500)
Eosinophils Relative: 1.5 %
HCT: 37.1 % (ref 35.0–45.0)
Hemoglobin: 12.6 g/dL (ref 11.7–15.5)
Lymphs Abs: 2441 cells/uL (ref 850–3900)
MCH: 31.7 pg (ref 27.0–33.0)
MCHC: 34 g/dL (ref 32.0–36.0)
MCV: 93.2 fL (ref 80.0–100.0)
MPV: 12.6 fL — ABNORMAL HIGH (ref 7.5–12.5)
Monocytes Relative: 6.8 %
Neutro Abs: 4118 cells/uL (ref 1500–7800)
Neutrophils Relative %: 57.2 %
Platelets: 252 10*3/uL (ref 140–400)
RBC: 3.98 10*6/uL (ref 3.80–5.10)
RDW: 12.4 % (ref 11.0–15.0)
Total Lymphocyte: 33.9 %
WBC: 7.2 10*3/uL (ref 3.8–10.8)

## 2022-09-29 LAB — VITAMIN D 25 HYDROXY (VIT D DEFICIENCY, FRACTURES): Vit D, 25-Hydroxy: 13 ng/mL — ABNORMAL LOW (ref 30–100)

## 2022-09-29 LAB — COMPREHENSIVE METABOLIC PANEL
AG Ratio: 1.4 (calc) (ref 1.0–2.5)
ALT: 9 U/L (ref 6–29)
AST: 16 U/L (ref 10–30)
Albumin: 4.5 g/dL (ref 3.6–5.1)
Alkaline phosphatase (APISO): 68 U/L (ref 31–125)
BUN: 10 mg/dL (ref 7–25)
CO2: 26 mmol/L (ref 20–32)
Calcium: 9.9 mg/dL (ref 8.6–10.2)
Chloride: 102 mmol/L (ref 98–110)
Creat: 0.93 mg/dL (ref 0.50–0.96)
Globulin: 3.3 g/dL (calc) (ref 1.9–3.7)
Glucose, Bld: 84 mg/dL (ref 65–99)
Potassium: 4.5 mmol/L (ref 3.5–5.3)
Sodium: 136 mmol/L (ref 135–146)
Total Bilirubin: 0.5 mg/dL (ref 0.2–1.2)
Total Protein: 7.8 g/dL (ref 6.1–8.1)

## 2022-09-29 LAB — LIPID PANEL
Cholesterol: 140 mg/dL (ref <200)
HDL: 66 mg/dL (ref >=50)
LDL Cholesterol (Calc): 52 mg/dL (calc)
Non-HDL Cholesterol (Calc): 74 mg/dL (calc) (ref <130)
Total CHOL/HDL Ratio: 2.1 (calc) (ref <5.0)
Triglycerides: 134 mg/dL (ref <150)

## 2022-09-29 LAB — TSH: TSH: 1.46 mIU/L

## 2022-09-29 LAB — VITAMIN B12: Vitamin B-12: 289 pg/mL (ref 200–1100)

## 2022-10-02 ENCOUNTER — Other Ambulatory Visit: Payer: Self-pay | Admitting: Family

## 2022-10-02 DIAGNOSIS — R7989 Other specified abnormal findings of blood chemistry: Secondary | ICD-10-CM

## 2022-10-02 MED ORDER — VITAMIN D (ERGOCALCIFEROL) 1.25 MG (50000 UNIT) PO CAPS: 50000.0000 [IU] | ORAL_CAPSULE | ORAL | 0 refills | Status: AC

## 2022-10-08 ENCOUNTER — Ambulatory Visit (HOSPITAL_BASED_OUTPATIENT_CLINIC_OR_DEPARTMENT_OTHER)
Admission: RE | Admit: 2022-10-08 | Discharge: 2022-10-08 | Disposition: A | Discharge status: Home / Self Care | Payer: Managed Care, Other (non HMO) | Source: Ambulatory Visit | Attending: Family | Admitting: Family

## 2022-10-08 DIAGNOSIS — R7989 Other specified abnormal findings of blood chemistry: Secondary | ICD-10-CM | POA: Insufficient documentation

## 2022-10-09 ENCOUNTER — Other Ambulatory Visit: Payer: Self-pay | Admitting: Family

## 2022-10-09 DIAGNOSIS — E042 Nontoxic multinodular goiter: Secondary | ICD-10-CM

## 2022-10-10 NOTE — Progress Notes (Signed)
Called pt and left a VM asking pt to call the office back to go over [place] results.

## 2022-10-15 ENCOUNTER — Other Ambulatory Visit: Payer: Self-pay | Admitting: Surgery

## 2023-10-08 ENCOUNTER — Encounter: Admitting: Family

## 2023-10-15 ENCOUNTER — Ambulatory Visit (INDEPENDENT_AMBULATORY_CARE_PROVIDER_SITE_OTHER): Admitting: Family

## 2023-10-15 ENCOUNTER — Encounter: Payer: Self-pay | Admitting: Family

## 2023-10-15 VITALS — BP 116/74 | HR 58 | Temp 98.1°F | Ht 66.0 in | Wt 166.2 lb

## 2023-10-15 DIAGNOSIS — Z0000 Encounter for general adult medical examination without abnormal findings: Secondary | ICD-10-CM | POA: Diagnosis not present

## 2023-10-15 DIAGNOSIS — E041 Nontoxic single thyroid nodule: Secondary | ICD-10-CM

## 2023-10-15 DIAGNOSIS — R7989 Other specified abnormal findings of blood chemistry: Secondary | ICD-10-CM

## 2023-10-15 DIAGNOSIS — E559 Vitamin D deficiency, unspecified: Secondary | ICD-10-CM | POA: Diagnosis not present

## 2023-10-15 DIAGNOSIS — Z124 Encounter for screening for malignant neoplasm of cervix: Secondary | ICD-10-CM

## 2023-10-15 DIAGNOSIS — Z13220 Encounter for screening for lipoid disorders: Secondary | ICD-10-CM

## 2023-10-15 DIAGNOSIS — E538 Deficiency of other specified B group vitamins: Secondary | ICD-10-CM

## 2023-10-15 LAB — LIPID PANEL
Cholesterol: 133 mg/dL (ref 0–200)
HDL: 54 mg/dL (ref >39.00)
LDL Cholesterol: 63 mg/dL (ref 0–99)
NonHDL: 78.92
Total CHOL/HDL Ratio: 2
Triglycerides: 78 mg/dL (ref 0.0–149.0)
VLDL: 15.6 mg/dL (ref 0.0–40.0)

## 2023-10-15 LAB — VITAMIN D 25 HYDROXY (VIT D DEFICIENCY, FRACTURES): VITD: <7 ng/mL — ABNORMAL LOW (ref 30.00–100.00)

## 2023-10-15 LAB — COMPREHENSIVE METABOLIC PANEL WITH GFR
ALT: 8 U/L (ref 0–35)
AST: 13 U/L (ref 0–37)
Albumin: 4.4 g/dL (ref 3.5–5.2)
Alkaline Phosphatase: 67 U/L (ref 39–117)
BUN: 7 mg/dL (ref 6–23)
CO2: 29 meq/L (ref 19–32)
Calcium: 9.8 mg/dL (ref 8.4–10.5)
Chloride: 104 meq/L (ref 96–112)
Creatinine, Ser: 0.88 mg/dL (ref 0.40–1.20)
GFR: 91.8 mL/min (ref >60.00)
Glucose, Bld: 93 mg/dL (ref 70–99)
Potassium: 4.6 meq/L (ref 3.5–5.1)
Sodium: 138 meq/L (ref 135–145)
Total Bilirubin: 0.5 mg/dL (ref 0.2–1.2)
Total Protein: 7.3 g/dL (ref 6.0–8.3)

## 2023-10-15 LAB — CBC WITH DIFFERENTIAL/PLATELET
Basophils Absolute: 0 10*3/uL (ref 0.0–0.1)
Basophils Relative: 0.6 % (ref 0.0–3.0)
Eosinophils Absolute: 0.1 10*3/uL (ref 0.0–0.7)
Eosinophils Relative: 1.5 % (ref 0.0–5.0)
HCT: 39.6 % (ref 36.0–46.0)
Hemoglobin: 12.9 g/dL (ref 12.0–15.0)
Lymphocytes Relative: 35 % (ref 12.0–46.0)
Lymphs Abs: 2 10*3/uL (ref 0.7–4.0)
MCHC: 32.7 g/dL (ref 30.0–36.0)
MCV: 95.7 fl (ref 78.0–100.0)
Monocytes Absolute: 0.4 10*3/uL (ref 0.1–1.0)
Monocytes Relative: 6.2 % (ref 3.0–12.0)
Neutro Abs: 3.3 10*3/uL (ref 1.4–7.7)
Neutrophils Relative %: 56.7 % (ref 43.0–77.0)
Platelets: 268 10*3/uL (ref 150.0–400.0)
RBC: 4.14 Mil/uL (ref 3.87–5.11)
RDW: 12.9 % (ref 11.5–15.5)
WBC: 5.8 10*3/uL (ref 4.0–10.5)

## 2023-10-15 LAB — TSH: TSH: 1.34 u[IU]/mL (ref 0.35–5.50)

## 2023-10-15 LAB — VITAMIN B12: Vitamin B-12: 168 pg/mL — ABNORMAL LOW (ref 211–911)

## 2023-10-15 NOTE — Progress Notes (Signed)
 Theresa Edwards is a 23 y.o. 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   No current facility-administered medications for this visit.    Allergies: Patient has no known allergies.  Past Medical History:  Diagnosis Date   Complication of anesthesia    has hx. 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   Scoliosis     Past Surgical History:  Procedure Laterality Date   KNEE ARTHROSCOPY WITH ANTERIOR CRUCIATE LIGAMENT (ACL) REPAIR WITH HAMSTRING GRAFT Right 11/30/2014   Procedure: RIGHT KNEE ARTHROSCOPY WITH ANTERIOR CRUCIATE LIGAMENT (ACL)  AUTOGRAFT  HAMSTRING, GRAFT LINK TECHNIQUE;  Surgeon: Elly Habermann, MD;  Location: [facility];  Service: Orthopedics;  Laterality: Right;   MENISCUS REPAIR Right 11/30/2014   Procedure: LATERAL MENISCUS REPAIR;  Surgeon: Elly Habermann, MD;  Location: [facility];  Service: Orthopedics;  Laterality: Right;   SHOULDER ARTHROSCOPY WITH BANKART REPAIR Right 12/10/2016   Procedure: SHOULDER ARTHROSCOPY WITH CAPSULORRHAPHY  REPAIR WITH SCALENE BLOCK;  Surgeon: Elly Habermann, MD;  Location: [facility];  Service: Orthopedics;  Laterality: Right;   SHOULDER ARTHROSCOPY WITH CAPSULORRHAPHY Right 12/09/2017   Procedure: RIGHT SHOULDER REVISION ANTERIOR AND [facility];  Surgeon: Elly Habermann, MD;  Location: [facility];  Service: Orthopedics;  Laterality: Right;  GENERAL,PRE/POST OP SCALENE   WISDOM TOOTH EXTRACTION      Family History  Problem Relation Age of  Onset   Heart disease Maternal Aunt        MI   Heart disease Paternal Grandfather        MI   Hypertension Mother    Hypertension Maternal Uncle    Kidney disease Maternal Uncle     Social History   Tobacco Use   Smoking status: Never   Smokeless tobacco: Never  Substance Use Topics   Alcohol use: No    Subjective:   Presents for yearly CPE; patient is now working and living in [place]; has gained 20 pounds in the past year; admits eating fast food very regularly/ drinking sodas/ sugary drinks;  Did not get set up with GYN as discussed last year- she is agreeable to referral; may end up establishing with provider in [place];   Review of Systems  Constitutional: Negative.   HENT: Negative.    Eyes: Negative.   Respiratory: Negative.    Cardiovascular: Negative.   Gastrointestinal: Negative.   Genitourinary: Negative.   Musculoskeletal: Negative.   Skin: Negative.   Neurological: Negative.   Endo/Heme/Allergies: Negative.   Psychiatric/Behavioral: Negative.        Objective:  Vitals:   10/15/23 1312  BP: 116/74  Pulse: (!) 58  Temp: 98.1 F (36.7 C)  TempSrc: Oral  SpO2: 99%  Weight: 166 lb 3.2 oz (75.4 kg)  Height: 5\' 6"  (1.676 m)    General: Well developed, well nourished, in no acute distress  Skin : Warm and dry.  Head: Normocephalic and atraumatic  Eyes: Sclera and conjunctiva clear; pupils round and reactive to light; extraocular movements intact  Ears: External  normal; canals clear; tympanic membranes normal  Oropharynx: Pink, supple. No suspicious lesions  Neck: Supple without thyromegaly, adenopathy  Lungs: Respirations unlabored; clear to auscultation bilaterally without wheeze, rales, rhonchi  CVS exam: normal rate and regular rhythm.  Abdomen: Soft; nontender; nondistended; normoactive bowel sounds; no masses or hepatosplenomegaly  Musculoskeletal: No deformities; no active joint inflammation  Extremities: No edema, cyanosis, clubbing   Vessels: Symmetric bilaterally  Neurologic: Alert and oriented; speech intact; face symmetrical; moves all extremities well; CNII-XII intact without focal deficit   Assessment:  1. PE (physical exam), annual   2. Thyroid  nodule   3. Vitamin D  deficiency   4. Low vitamin B12 level   5. Lipid screening   6. Cervical cancer screening     Plan:  Age appropriate preventive healthcare needs addressed; encouraged regular eye doctor and dental exams; encouraged regular exercise/ healthy eating habits- limit fast food and sodas/ sugars drinks; she does ask about weight loss medication today and explained that do not think this is appropriate place to start at this time; will update labs and refills as needed today; follow-up in 1 year, sooner prn. Order for thyroid  ultrasound updated- discussed that surgeon did want her to do a 1 year follow up on the thyroid  nodule.    No follow-ups on file.  Orders Placed This Encounter  Procedures   US  THYROID     Standing Status:   Future    Expiration Date:   10/14/2024    Reason for Exam (SYMPTOM  OR DIAGNOSIS REQUIRED):   thryoid nodule/ 1 year follow up    Preferred imaging location?:   [facility]   Vitamin D  (25 hydroxy)   B12   CBC with Differential/Platelet   Comp Met (CMET)   Lipid panel   TSH   Ambulatory referral to Obstetrics / Gynecology    Referral Priority:   Routine    Referral Type:   Consultation    Referral Reason:   Specialty Services Required    Requested Specialty:   Obstetrics and Gynecology    Number of Visits Requested:   1    Requested Prescriptions    No prescriptions requested or ordered in this encounter

## 2023-10-16 ENCOUNTER — Other Ambulatory Visit: Payer: Self-pay | Admitting: Family

## 2023-10-16 MED ORDER — VITAMIN D (ERGOCALCIFEROL) 1.25 MG (50000 UNIT) PO CAPS: 50000.0000 [IU] | ORAL_CAPSULE | ORAL | 0 refills | Status: AC

## 2023-10-18 ENCOUNTER — Telehealth (HOSPITAL_BASED_OUTPATIENT_CLINIC_OR_DEPARTMENT_OTHER): Payer: Self-pay

## 2023-10-22 ENCOUNTER — Ambulatory Visit (INDEPENDENT_AMBULATORY_CARE_PROVIDER_SITE_OTHER): Admitting: Emergency Medicine

## 2023-10-22 DIAGNOSIS — R7989 Other specified abnormal findings of blood chemistry: Secondary | ICD-10-CM

## 2023-10-22 MED ORDER — CYANOCOBALAMIN 1000 MCG/ML IJ SOLN
1000.0000 ug | Freq: Once | INTRAMUSCULAR | Status: AC
  Administered 2023-10-22: 1000 ug via INTRAMUSCULAR

## 2023-10-22 NOTE — Progress Notes (Signed)
 Patient here for monthly b12 injection per physicians order.  Injection given in right deltoid and patient tolerated well.

## 2023-10-29 ENCOUNTER — Ambulatory Visit

## 2023-10-29 ENCOUNTER — Telehealth: Payer: Self-pay

## 2023-10-29 NOTE — Telephone Encounter (Signed)
 Pt called and r/s Copied from CRM [id number]. Topic: Appointments - Scheduling Inquiry for Clinic >> Oct 29, 2023  9:54 AM Jethro Morrison wrote: Reason for CRM: PT CALLED STATED SHE WOULD NOT BE ABLE TO MAKE HER APPT FOR HER B12 SHOT TODAY AND WOULD LIKE TO RESCH. I DID NOT RESCH DUE TO NO ORDER FOR THE B12 SHOT. THANKS

## 2023-11-04 ENCOUNTER — Ambulatory Visit: Admitting: *Deleted

## 2023-11-04 DIAGNOSIS — R7989 Other specified abnormal findings of blood chemistry: Secondary | ICD-10-CM

## 2023-11-04 MED ORDER — CYANOCOBALAMIN 1000 MCG/ML IJ SOLN
1000.0000 ug | Freq: Once | INTRAMUSCULAR | Status: AC
  Administered 2023-11-04: 1000 ug via INTRAMUSCULAR

## 2023-11-04 NOTE — Progress Notes (Signed)
Patient here for 2/4 weekly b12 injection per physicians order.  Injection given in right deltoid and patient tolerated well.

## 2023-11-05 ENCOUNTER — Ambulatory Visit (HOSPITAL_BASED_OUTPATIENT_CLINIC_OR_DEPARTMENT_OTHER)
Admission: RE | Admit: 2023-11-05 | Discharge: 2023-11-05 | Disposition: A | Discharge status: Home / Self Care | Source: Ambulatory Visit | Attending: Family | Admitting: Family

## 2023-11-05 ENCOUNTER — Ambulatory Visit (HOSPITAL_BASED_OUTPATIENT_CLINIC_OR_DEPARTMENT_OTHER)

## 2023-11-05 DIAGNOSIS — E041 Nontoxic single thyroid nodule: Secondary | ICD-10-CM | POA: Diagnosis not present

## 2023-11-05 DIAGNOSIS — E042 Nontoxic multinodular goiter: Secondary | ICD-10-CM | POA: Diagnosis not present

## 2023-11-12 ENCOUNTER — Ambulatory Visit

## 2023-11-12 ENCOUNTER — Ambulatory Visit: Payer: Self-pay | Admitting: Family

## 2023-11-21 ENCOUNTER — Ambulatory Visit

## 2024-01-07 ENCOUNTER — Telehealth: Payer: Self-pay | Admitting: Family

## 2024-01-07 NOTE — Telephone Encounter (Signed)
 Good morning I need lab orders for this pt

## 2024-01-13 ENCOUNTER — Other Ambulatory Visit: Payer: Self-pay | Admitting: Family

## 2024-01-13 DIAGNOSIS — E559 Vitamin D deficiency, unspecified: Secondary | ICD-10-CM

## 2024-01-13 DIAGNOSIS — R7989 Other specified abnormal findings of blood chemistry: Secondary | ICD-10-CM

## 2024-01-20 ENCOUNTER — Other Ambulatory Visit: Payer: Self-pay

## 2024-03-04 DIAGNOSIS — M25561 Pain in right knee: Secondary | ICD-10-CM | POA: Diagnosis not present

## 2024-03-25 DIAGNOSIS — M25561 Pain in right knee: Secondary | ICD-10-CM | POA: Diagnosis not present
